# Patient Record
Sex: Female | Born: 1935
Health system: Southern US, Community
[De-identification: ages and names within clinical notes are randomized; demographics above are authoritative.]

## PROBLEM LIST (undated history)

## (undated) DIAGNOSIS — E7800 Pure hypercholesterolemia, unspecified: Secondary | ICD-10-CM

## (undated) DIAGNOSIS — I10 Essential (primary) hypertension: Secondary | ICD-10-CM

## (undated) DIAGNOSIS — M109 Gout, unspecified: Secondary | ICD-10-CM

## (undated) HISTORY — PX: TOTAL HIP ARTHROPLASTY: SHX124

---

## 2002-05-08 ENCOUNTER — Encounter: Payer: Self-pay | Admitting: Internal Medicine

## 2002-05-08 ENCOUNTER — Encounter: Admission: RE | Admit: 2002-05-08 | Discharge: 2002-05-08 | Payer: Self-pay | Admitting: Internal Medicine

## 2003-10-28 ENCOUNTER — Encounter: Admission: RE | Admit: 2003-10-28 | Discharge: 2003-10-28 | Payer: Self-pay | Admitting: Internal Medicine

## 2004-10-17 ENCOUNTER — Encounter: Admission: RE | Admit: 2004-10-17 | Discharge: 2004-10-17 | Payer: Self-pay | Admitting: Internal Medicine

## 2005-04-26 ENCOUNTER — Encounter: Admission: RE | Admit: 2005-04-26 | Discharge: 2005-04-26 | Payer: Self-pay | Admitting: Internal Medicine

## 2008-03-18 ENCOUNTER — Encounter: Admission: RE | Admit: 2008-03-18 | Discharge: 2008-03-18 | Payer: Self-pay | Admitting: Internal Medicine

## 2009-11-18 ENCOUNTER — Encounter: Admission: RE | Admit: 2009-11-18 | Discharge: 2009-11-18 | Payer: Self-pay | Admitting: Internal Medicine

## 2009-12-06 ENCOUNTER — Encounter: Admission: RE | Admit: 2009-12-06 | Discharge: 2009-12-06 | Payer: Self-pay | Admitting: Internal Medicine

## 2012-10-24 ENCOUNTER — Other Ambulatory Visit: Payer: Self-pay | Admitting: Internal Medicine

## 2012-10-24 ENCOUNTER — Ambulatory Visit
Admission: RE | Admit: 2012-10-24 | Discharge: 2012-10-24 | Disposition: A | Discharge status: Home / Self Care | Payer: Commercial Managed Care - HMO | Source: Ambulatory Visit | Attending: Internal Medicine | Admitting: Internal Medicine

## 2012-10-24 DIAGNOSIS — R0789 Other chest pain: Secondary | ICD-10-CM

## 2013-06-17 ENCOUNTER — Other Ambulatory Visit: Payer: Self-pay | Admitting: Internal Medicine

## 2013-06-17 ENCOUNTER — Ambulatory Visit
Admission: RE | Admit: 2013-06-17 | Discharge: 2013-06-17 | Disposition: A | Discharge status: Home / Self Care | Payer: Commercial Managed Care - HMO | Source: Ambulatory Visit | Attending: Internal Medicine | Admitting: Internal Medicine

## 2013-06-17 DIAGNOSIS — R0602 Shortness of breath: Secondary | ICD-10-CM

## 2013-06-17 DIAGNOSIS — R0789 Other chest pain: Secondary | ICD-10-CM

## 2013-06-17 DIAGNOSIS — R911 Solitary pulmonary nodule: Secondary | ICD-10-CM

## 2015-01-13 DIAGNOSIS — H35033 Hypertensive retinopathy, bilateral: Secondary | ICD-10-CM | POA: Diagnosis not present

## 2015-01-19 DIAGNOSIS — Z139 Encounter for screening, unspecified: Secondary | ICD-10-CM | POA: Diagnosis not present

## 2015-01-19 DIAGNOSIS — Z780 Asymptomatic menopausal state: Secondary | ICD-10-CM | POA: Diagnosis not present

## 2015-01-19 DIAGNOSIS — Z1231 Encounter for screening mammogram for malignant neoplasm of breast: Secondary | ICD-10-CM | POA: Diagnosis not present

## 2015-03-02 DIAGNOSIS — N183 Chronic kidney disease, stage 3 (moderate): Secondary | ICD-10-CM | POA: Diagnosis not present

## 2015-03-02 DIAGNOSIS — I10 Essential (primary) hypertension: Secondary | ICD-10-CM | POA: Diagnosis not present

## 2015-03-07 DIAGNOSIS — Z6829 Body mass index (BMI) 29.0-29.9, adult: Secondary | ICD-10-CM | POA: Diagnosis not present

## 2015-03-07 DIAGNOSIS — I10 Essential (primary) hypertension: Secondary | ICD-10-CM | POA: Diagnosis not present

## 2015-03-07 DIAGNOSIS — H409 Unspecified glaucoma: Secondary | ICD-10-CM | POA: Diagnosis not present

## 2015-03-07 DIAGNOSIS — E663 Overweight: Secondary | ICD-10-CM | POA: Diagnosis not present

## 2015-03-07 DIAGNOSIS — M1990 Unspecified osteoarthritis, unspecified site: Secondary | ICD-10-CM | POA: Diagnosis not present

## 2015-03-07 DIAGNOSIS — M109 Gout, unspecified: Secondary | ICD-10-CM | POA: Diagnosis not present

## 2015-03-07 DIAGNOSIS — E785 Hyperlipidemia, unspecified: Secondary | ICD-10-CM | POA: Diagnosis not present

## 2015-03-07 DIAGNOSIS — M545 Low back pain: Secondary | ICD-10-CM | POA: Diagnosis not present

## 2015-03-12 DIAGNOSIS — Z131 Encounter for screening for diabetes mellitus: Secondary | ICD-10-CM | POA: Diagnosis not present

## 2015-03-12 DIAGNOSIS — N183 Chronic kidney disease, stage 3 (moderate): Secondary | ICD-10-CM | POA: Diagnosis not present

## 2015-03-12 DIAGNOSIS — E7211 Homocystinuria: Secondary | ICD-10-CM | POA: Diagnosis not present

## 2015-03-12 DIAGNOSIS — I119 Hypertensive heart disease without heart failure: Secondary | ICD-10-CM | POA: Diagnosis not present

## 2015-03-12 DIAGNOSIS — Z01118 Encounter for examination of ears and hearing with other abnormal findings: Secondary | ICD-10-CM | POA: Diagnosis not present

## 2015-03-12 DIAGNOSIS — Z0000 Encounter for general adult medical examination without abnormal findings: Secondary | ICD-10-CM | POA: Diagnosis not present

## 2015-03-12 DIAGNOSIS — J302 Other seasonal allergic rhinitis: Secondary | ICD-10-CM | POA: Diagnosis not present

## 2015-03-12 DIAGNOSIS — E559 Vitamin D deficiency, unspecified: Secondary | ICD-10-CM | POA: Diagnosis not present

## 2015-03-12 DIAGNOSIS — I10 Essential (primary) hypertension: Secondary | ICD-10-CM | POA: Diagnosis not present

## 2015-03-12 DIAGNOSIS — E785 Hyperlipidemia, unspecified: Secondary | ICD-10-CM | POA: Diagnosis not present

## 2015-04-01 DIAGNOSIS — I10 Essential (primary) hypertension: Secondary | ICD-10-CM | POA: Diagnosis not present

## 2015-04-01 DIAGNOSIS — N183 Chronic kidney disease, stage 3 (moderate): Secondary | ICD-10-CM | POA: Diagnosis not present

## 2015-04-08 DIAGNOSIS — N183 Chronic kidney disease, stage 3 (moderate): Secondary | ICD-10-CM | POA: Diagnosis not present

## 2015-04-08 DIAGNOSIS — I10 Essential (primary) hypertension: Secondary | ICD-10-CM | POA: Diagnosis not present

## 2015-05-13 DIAGNOSIS — I10 Essential (primary) hypertension: Secondary | ICD-10-CM | POA: Diagnosis not present

## 2015-05-13 DIAGNOSIS — N183 Chronic kidney disease, stage 3 (moderate): Secondary | ICD-10-CM | POA: Diagnosis not present

## 2015-05-20 DIAGNOSIS — Z131 Encounter for screening for diabetes mellitus: Secondary | ICD-10-CM | POA: Diagnosis not present

## 2015-05-20 DIAGNOSIS — I10 Essential (primary) hypertension: Secondary | ICD-10-CM | POA: Diagnosis not present

## 2015-05-20 DIAGNOSIS — J302 Other seasonal allergic rhinitis: Secondary | ICD-10-CM | POA: Diagnosis not present

## 2015-05-20 DIAGNOSIS — E7211 Homocystinuria: Secondary | ICD-10-CM | POA: Diagnosis not present

## 2015-05-20 DIAGNOSIS — E559 Vitamin D deficiency, unspecified: Secondary | ICD-10-CM | POA: Diagnosis not present

## 2015-05-20 DIAGNOSIS — E785 Hyperlipidemia, unspecified: Secondary | ICD-10-CM | POA: Diagnosis not present

## 2015-05-20 DIAGNOSIS — N183 Chronic kidney disease, stage 3 (moderate): Secondary | ICD-10-CM | POA: Diagnosis not present

## 2015-05-20 DIAGNOSIS — Z0000 Encounter for general adult medical examination without abnormal findings: Secondary | ICD-10-CM | POA: Diagnosis not present

## 2015-05-20 DIAGNOSIS — I119 Hypertensive heart disease without heart failure: Secondary | ICD-10-CM | POA: Diagnosis not present

## 2015-06-11 DIAGNOSIS — N183 Chronic kidney disease, stage 3 (moderate): Secondary | ICD-10-CM | POA: Diagnosis not present

## 2015-06-11 DIAGNOSIS — I10 Essential (primary) hypertension: Secondary | ICD-10-CM | POA: Diagnosis not present

## 2015-07-19 DIAGNOSIS — H401131 Primary open-angle glaucoma, bilateral, mild stage: Secondary | ICD-10-CM | POA: Diagnosis not present

## 2015-08-09 DIAGNOSIS — R040 Epistaxis: Secondary | ICD-10-CM | POA: Diagnosis not present

## 2015-08-09 DIAGNOSIS — I10 Essential (primary) hypertension: Secondary | ICD-10-CM | POA: Diagnosis not present

## 2015-08-10 DIAGNOSIS — I10 Essential (primary) hypertension: Secondary | ICD-10-CM | POA: Diagnosis not present

## 2015-08-10 DIAGNOSIS — N183 Chronic kidney disease, stage 3 (moderate): Secondary | ICD-10-CM | POA: Diagnosis not present

## 2015-08-10 DIAGNOSIS — I119 Hypertensive heart disease without heart failure: Secondary | ICD-10-CM | POA: Diagnosis not present

## 2015-08-10 DIAGNOSIS — R0789 Other chest pain: Secondary | ICD-10-CM | POA: Diagnosis not present

## 2015-08-10 DIAGNOSIS — E785 Hyperlipidemia, unspecified: Secondary | ICD-10-CM | POA: Diagnosis not present

## 2015-08-10 DIAGNOSIS — E7211 Homocystinuria: Secondary | ICD-10-CM | POA: Diagnosis not present

## 2015-08-10 DIAGNOSIS — J302 Other seasonal allergic rhinitis: Secondary | ICD-10-CM | POA: Diagnosis not present

## 2015-08-10 DIAGNOSIS — E559 Vitamin D deficiency, unspecified: Secondary | ICD-10-CM | POA: Diagnosis not present

## 2015-08-11 DIAGNOSIS — Z7982 Long term (current) use of aspirin: Secondary | ICD-10-CM | POA: Diagnosis not present

## 2015-08-11 DIAGNOSIS — I10 Essential (primary) hypertension: Secondary | ICD-10-CM | POA: Diagnosis not present

## 2015-08-11 DIAGNOSIS — R040 Epistaxis: Secondary | ICD-10-CM | POA: Diagnosis not present

## 2015-08-27 DIAGNOSIS — R0602 Shortness of breath: Secondary | ICD-10-CM | POA: Diagnosis not present

## 2015-08-27 DIAGNOSIS — C3490 Malignant neoplasm of unspecified part of unspecified bronchus or lung: Secondary | ICD-10-CM | POA: Diagnosis not present

## 2015-08-27 DIAGNOSIS — G8922 Chronic post-thoracotomy pain: Secondary | ICD-10-CM | POA: Diagnosis not present

## 2015-08-27 DIAGNOSIS — Z87891 Personal history of nicotine dependence: Secondary | ICD-10-CM | POA: Diagnosis not present

## 2015-08-27 DIAGNOSIS — F17200 Nicotine dependence, unspecified, uncomplicated: Secondary | ICD-10-CM | POA: Diagnosis not present

## 2015-09-16 DIAGNOSIS — Z122 Encounter for screening for malignant neoplasm of respiratory organs: Secondary | ICD-10-CM | POA: Diagnosis not present

## 2015-09-16 DIAGNOSIS — Z87891 Personal history of nicotine dependence: Secondary | ICD-10-CM | POA: Diagnosis not present

## 2015-09-23 DIAGNOSIS — R0789 Other chest pain: Secondary | ICD-10-CM | POA: Diagnosis not present

## 2015-09-23 DIAGNOSIS — G8929 Other chronic pain: Secondary | ICD-10-CM | POA: Diagnosis not present

## 2015-11-19 DIAGNOSIS — I10 Essential (primary) hypertension: Secondary | ICD-10-CM | POA: Diagnosis not present

## 2015-11-19 DIAGNOSIS — I119 Hypertensive heart disease without heart failure: Secondary | ICD-10-CM | POA: Diagnosis not present

## 2015-11-19 DIAGNOSIS — J302 Other seasonal allergic rhinitis: Secondary | ICD-10-CM | POA: Diagnosis not present

## 2015-11-19 DIAGNOSIS — E7211 Homocystinuria: Secondary | ICD-10-CM | POA: Diagnosis not present

## 2015-11-19 DIAGNOSIS — E559 Vitamin D deficiency, unspecified: Secondary | ICD-10-CM | POA: Diagnosis not present

## 2015-11-19 DIAGNOSIS — N183 Chronic kidney disease, stage 3 (moderate): Secondary | ICD-10-CM | POA: Diagnosis not present

## 2015-11-19 DIAGNOSIS — E785 Hyperlipidemia, unspecified: Secondary | ICD-10-CM | POA: Diagnosis not present

## 2016-05-16 DIAGNOSIS — R0789 Other chest pain: Secondary | ICD-10-CM | POA: Diagnosis not present

## 2016-05-16 DIAGNOSIS — E7211 Homocystinuria: Secondary | ICD-10-CM | POA: Diagnosis not present

## 2016-05-16 DIAGNOSIS — I119 Hypertensive heart disease without heart failure: Secondary | ICD-10-CM | POA: Diagnosis not present

## 2016-05-16 DIAGNOSIS — E785 Hyperlipidemia, unspecified: Secondary | ICD-10-CM | POA: Diagnosis not present

## 2016-05-16 DIAGNOSIS — Z0000 Encounter for general adult medical examination without abnormal findings: Secondary | ICD-10-CM | POA: Diagnosis not present

## 2016-05-16 DIAGNOSIS — I10 Essential (primary) hypertension: Secondary | ICD-10-CM | POA: Diagnosis not present

## 2016-05-16 DIAGNOSIS — J302 Other seasonal allergic rhinitis: Secondary | ICD-10-CM | POA: Diagnosis not present

## 2016-05-16 DIAGNOSIS — N183 Chronic kidney disease, stage 3 (moderate): Secondary | ICD-10-CM | POA: Diagnosis not present

## 2016-05-16 DIAGNOSIS — E559 Vitamin D deficiency, unspecified: Secondary | ICD-10-CM | POA: Diagnosis not present

## 2016-06-16 ENCOUNTER — Other Ambulatory Visit: Payer: Self-pay | Admitting: Internal Medicine

## 2016-06-16 DIAGNOSIS — M79605 Pain in left leg: Secondary | ICD-10-CM | POA: Diagnosis not present

## 2016-06-16 DIAGNOSIS — E559 Vitamin D deficiency, unspecified: Secondary | ICD-10-CM | POA: Diagnosis not present

## 2016-06-16 DIAGNOSIS — R609 Edema, unspecified: Secondary | ICD-10-CM

## 2016-06-16 DIAGNOSIS — E7211 Homocystinuria: Secondary | ICD-10-CM | POA: Diagnosis not present

## 2016-06-16 DIAGNOSIS — E785 Hyperlipidemia, unspecified: Secondary | ICD-10-CM | POA: Diagnosis not present

## 2016-06-16 DIAGNOSIS — J302 Other seasonal allergic rhinitis: Secondary | ICD-10-CM | POA: Diagnosis not present

## 2016-06-16 DIAGNOSIS — R55 Syncope and collapse: Secondary | ICD-10-CM | POA: Diagnosis not present

## 2016-06-16 DIAGNOSIS — N183 Chronic kidney disease, stage 3 (moderate): Secondary | ICD-10-CM | POA: Diagnosis not present

## 2016-06-16 DIAGNOSIS — I119 Hypertensive heart disease without heart failure: Secondary | ICD-10-CM | POA: Diagnosis not present

## 2016-06-16 DIAGNOSIS — I10 Essential (primary) hypertension: Secondary | ICD-10-CM | POA: Diagnosis not present

## 2016-06-19 ENCOUNTER — Other Ambulatory Visit: Payer: Medicare HMO

## 2016-06-22 DIAGNOSIS — M7989 Other specified soft tissue disorders: Secondary | ICD-10-CM | POA: Diagnosis not present

## 2016-06-22 DIAGNOSIS — M79605 Pain in left leg: Secondary | ICD-10-CM | POA: Diagnosis not present

## 2016-07-03 DIAGNOSIS — N183 Chronic kidney disease, stage 3 (moderate): Secondary | ICD-10-CM | POA: Diagnosis not present

## 2016-07-03 DIAGNOSIS — E785 Hyperlipidemia, unspecified: Secondary | ICD-10-CM | POA: Diagnosis not present

## 2016-07-03 DIAGNOSIS — I10 Essential (primary) hypertension: Secondary | ICD-10-CM | POA: Diagnosis not present

## 2016-07-03 DIAGNOSIS — M79605 Pain in left leg: Secondary | ICD-10-CM | POA: Diagnosis not present

## 2016-07-03 DIAGNOSIS — E7211 Homocystinuria: Secondary | ICD-10-CM | POA: Diagnosis not present

## 2016-07-03 DIAGNOSIS — Z01118 Encounter for examination of ears and hearing with other abnormal findings: Secondary | ICD-10-CM | POA: Diagnosis not present

## 2016-07-03 DIAGNOSIS — Z0000 Encounter for general adult medical examination without abnormal findings: Secondary | ICD-10-CM | POA: Diagnosis not present

## 2016-07-03 DIAGNOSIS — E559 Vitamin D deficiency, unspecified: Secondary | ICD-10-CM | POA: Diagnosis not present

## 2016-07-03 DIAGNOSIS — I119 Hypertensive heart disease without heart failure: Secondary | ICD-10-CM | POA: Diagnosis not present

## 2016-07-20 DIAGNOSIS — Z1231 Encounter for screening mammogram for malignant neoplasm of breast: Secondary | ICD-10-CM | POA: Diagnosis not present

## 2016-07-26 DIAGNOSIS — H524 Presbyopia: Secondary | ICD-10-CM | POA: Diagnosis not present

## 2016-09-12 DIAGNOSIS — M25512 Pain in left shoulder: Secondary | ICD-10-CM | POA: Diagnosis not present

## 2016-09-12 DIAGNOSIS — J302 Other seasonal allergic rhinitis: Secondary | ICD-10-CM | POA: Diagnosis not present

## 2016-09-12 DIAGNOSIS — E7211 Homocystinuria: Secondary | ICD-10-CM | POA: Diagnosis not present

## 2016-09-12 DIAGNOSIS — I10 Essential (primary) hypertension: Secondary | ICD-10-CM | POA: Diagnosis not present

## 2016-09-12 DIAGNOSIS — I119 Hypertensive heart disease without heart failure: Secondary | ICD-10-CM | POA: Diagnosis not present

## 2016-09-12 DIAGNOSIS — E559 Vitamin D deficiency, unspecified: Secondary | ICD-10-CM | POA: Diagnosis not present

## 2016-09-12 DIAGNOSIS — N183 Chronic kidney disease, stage 3 (moderate): Secondary | ICD-10-CM | POA: Diagnosis not present

## 2016-09-12 DIAGNOSIS — E785 Hyperlipidemia, unspecified: Secondary | ICD-10-CM | POA: Diagnosis not present

## 2016-11-22 DIAGNOSIS — I119 Hypertensive heart disease without heart failure: Secondary | ICD-10-CM | POA: Diagnosis not present

## 2016-11-22 DIAGNOSIS — I10 Essential (primary) hypertension: Secondary | ICD-10-CM | POA: Diagnosis not present

## 2017-01-18 DIAGNOSIS — N183 Chronic kidney disease, stage 3 (moderate): Secondary | ICD-10-CM | POA: Diagnosis not present

## 2017-01-18 DIAGNOSIS — E7211 Homocystinuria: Secondary | ICD-10-CM | POA: Diagnosis not present

## 2017-01-18 DIAGNOSIS — R002 Palpitations: Secondary | ICD-10-CM | POA: Diagnosis not present

## 2017-01-18 DIAGNOSIS — J302 Other seasonal allergic rhinitis: Secondary | ICD-10-CM | POA: Diagnosis not present

## 2017-01-18 DIAGNOSIS — I119 Hypertensive heart disease without heart failure: Secondary | ICD-10-CM | POA: Diagnosis not present

## 2017-01-18 DIAGNOSIS — E785 Hyperlipidemia, unspecified: Secondary | ICD-10-CM | POA: Diagnosis not present

## 2017-01-18 DIAGNOSIS — I10 Essential (primary) hypertension: Secondary | ICD-10-CM | POA: Diagnosis not present

## 2017-01-18 DIAGNOSIS — E559 Vitamin D deficiency, unspecified: Secondary | ICD-10-CM | POA: Diagnosis not present

## 2017-02-07 DIAGNOSIS — N183 Chronic kidney disease, stage 3 (moderate): Secondary | ICD-10-CM | POA: Diagnosis not present

## 2017-02-07 DIAGNOSIS — I10 Essential (primary) hypertension: Secondary | ICD-10-CM | POA: Diagnosis not present

## 2017-02-23 DIAGNOSIS — I10 Essential (primary) hypertension: Secondary | ICD-10-CM | POA: Diagnosis not present

## 2017-03-07 DIAGNOSIS — I10 Essential (primary) hypertension: Secondary | ICD-10-CM | POA: Diagnosis not present

## 2017-03-07 DIAGNOSIS — E785 Hyperlipidemia, unspecified: Secondary | ICD-10-CM | POA: Diagnosis not present

## 2017-03-29 DIAGNOSIS — F439 Reaction to severe stress, unspecified: Secondary | ICD-10-CM | POA: Diagnosis not present

## 2017-03-29 DIAGNOSIS — I10 Essential (primary) hypertension: Secondary | ICD-10-CM | POA: Diagnosis not present

## 2017-03-29 DIAGNOSIS — I119 Hypertensive heart disease without heart failure: Secondary | ICD-10-CM | POA: Diagnosis not present

## 2017-03-29 DIAGNOSIS — J302 Other seasonal allergic rhinitis: Secondary | ICD-10-CM | POA: Diagnosis not present

## 2017-03-29 DIAGNOSIS — E7211 Homocystinuria: Secondary | ICD-10-CM | POA: Diagnosis not present

## 2017-03-29 DIAGNOSIS — N183 Chronic kidney disease, stage 3 (moderate): Secondary | ICD-10-CM | POA: Diagnosis not present

## 2017-03-29 DIAGNOSIS — E559 Vitamin D deficiency, unspecified: Secondary | ICD-10-CM | POA: Diagnosis not present

## 2017-03-29 DIAGNOSIS — E785 Hyperlipidemia, unspecified: Secondary | ICD-10-CM | POA: Diagnosis not present

## 2017-04-18 DIAGNOSIS — I10 Essential (primary) hypertension: Secondary | ICD-10-CM | POA: Diagnosis not present

## 2017-04-18 DIAGNOSIS — E785 Hyperlipidemia, unspecified: Secondary | ICD-10-CM | POA: Diagnosis not present

## 2017-05-14 DIAGNOSIS — M2042 Other hammer toe(s) (acquired), left foot: Secondary | ICD-10-CM | POA: Diagnosis not present

## 2017-05-17 DIAGNOSIS — E785 Hyperlipidemia, unspecified: Secondary | ICD-10-CM | POA: Diagnosis not present

## 2017-05-17 DIAGNOSIS — F439 Reaction to severe stress, unspecified: Secondary | ICD-10-CM | POA: Diagnosis not present

## 2017-05-17 DIAGNOSIS — I10 Essential (primary) hypertension: Secondary | ICD-10-CM | POA: Diagnosis not present

## 2017-05-17 DIAGNOSIS — E559 Vitamin D deficiency, unspecified: Secondary | ICD-10-CM | POA: Diagnosis not present

## 2017-05-17 DIAGNOSIS — N183 Chronic kidney disease, stage 3 (moderate): Secondary | ICD-10-CM | POA: Diagnosis not present

## 2017-05-17 DIAGNOSIS — J302 Other seasonal allergic rhinitis: Secondary | ICD-10-CM | POA: Diagnosis not present

## 2017-05-17 DIAGNOSIS — Z0000 Encounter for general adult medical examination without abnormal findings: Secondary | ICD-10-CM | POA: Diagnosis not present

## 2017-05-17 DIAGNOSIS — E7211 Homocystinuria: Secondary | ICD-10-CM | POA: Diagnosis not present

## 2017-05-17 DIAGNOSIS — I119 Hypertensive heart disease without heart failure: Secondary | ICD-10-CM | POA: Diagnosis not present

## 2017-05-21 DIAGNOSIS — I10 Essential (primary) hypertension: Secondary | ICD-10-CM | POA: Diagnosis not present

## 2017-05-21 DIAGNOSIS — E785 Hyperlipidemia, unspecified: Secondary | ICD-10-CM | POA: Diagnosis not present

## 2017-05-21 DIAGNOSIS — Z7982 Long term (current) use of aspirin: Secondary | ICD-10-CM | POA: Diagnosis not present

## 2017-05-21 DIAGNOSIS — R002 Palpitations: Secondary | ICD-10-CM | POA: Diagnosis not present

## 2017-05-23 DIAGNOSIS — I10 Essential (primary) hypertension: Secondary | ICD-10-CM | POA: Diagnosis not present

## 2017-05-23 DIAGNOSIS — M2042 Other hammer toe(s) (acquired), left foot: Secondary | ICD-10-CM | POA: Diagnosis not present

## 2017-05-23 DIAGNOSIS — E785 Hyperlipidemia, unspecified: Secondary | ICD-10-CM | POA: Diagnosis not present

## 2017-07-13 DIAGNOSIS — E785 Hyperlipidemia, unspecified: Secondary | ICD-10-CM | POA: Diagnosis not present

## 2017-07-13 DIAGNOSIS — N183 Chronic kidney disease, stage 3 (moderate): Secondary | ICD-10-CM | POA: Diagnosis not present

## 2017-07-13 DIAGNOSIS — Z01118 Encounter for examination of ears and hearing with other abnormal findings: Secondary | ICD-10-CM | POA: Diagnosis not present

## 2017-07-13 DIAGNOSIS — I119 Hypertensive heart disease without heart failure: Secondary | ICD-10-CM | POA: Diagnosis not present

## 2017-07-13 DIAGNOSIS — Z0000 Encounter for general adult medical examination without abnormal findings: Secondary | ICD-10-CM | POA: Diagnosis not present

## 2017-07-13 DIAGNOSIS — Z136 Encounter for screening for cardiovascular disorders: Secondary | ICD-10-CM | POA: Diagnosis not present

## 2017-07-13 DIAGNOSIS — Z131 Encounter for screening for diabetes mellitus: Secondary | ICD-10-CM | POA: Diagnosis not present

## 2017-07-13 DIAGNOSIS — E7211 Homocystinuria: Secondary | ICD-10-CM | POA: Diagnosis not present

## 2017-07-13 DIAGNOSIS — I10 Essential (primary) hypertension: Secondary | ICD-10-CM | POA: Diagnosis not present

## 2017-08-10 DIAGNOSIS — Z780 Asymptomatic menopausal state: Secondary | ICD-10-CM | POA: Diagnosis not present

## 2017-08-10 DIAGNOSIS — E2839 Other primary ovarian failure: Secondary | ICD-10-CM | POA: Diagnosis not present

## 2017-08-10 DIAGNOSIS — Z1231 Encounter for screening mammogram for malignant neoplasm of breast: Secondary | ICD-10-CM | POA: Diagnosis not present

## 2017-08-10 DIAGNOSIS — Z13820 Encounter for screening for osteoporosis: Secondary | ICD-10-CM | POA: Diagnosis not present

## 2017-08-28 DIAGNOSIS — H524 Presbyopia: Secondary | ICD-10-CM | POA: Diagnosis not present

## 2017-09-07 DIAGNOSIS — I10 Essential (primary) hypertension: Secondary | ICD-10-CM | POA: Diagnosis not present

## 2017-09-07 DIAGNOSIS — F439 Reaction to severe stress, unspecified: Secondary | ICD-10-CM | POA: Diagnosis not present

## 2017-09-07 DIAGNOSIS — E559 Vitamin D deficiency, unspecified: Secondary | ICD-10-CM | POA: Diagnosis not present

## 2017-09-07 DIAGNOSIS — J302 Other seasonal allergic rhinitis: Secondary | ICD-10-CM | POA: Diagnosis not present

## 2017-09-07 DIAGNOSIS — N183 Chronic kidney disease, stage 3 (moderate): Secondary | ICD-10-CM | POA: Diagnosis not present

## 2017-09-07 DIAGNOSIS — I119 Hypertensive heart disease without heart failure: Secondary | ICD-10-CM | POA: Diagnosis not present

## 2017-09-07 DIAGNOSIS — E785 Hyperlipidemia, unspecified: Secondary | ICD-10-CM | POA: Diagnosis not present

## 2017-09-07 DIAGNOSIS — E7211 Homocystinuria: Secondary | ICD-10-CM | POA: Diagnosis not present

## 2017-09-21 DIAGNOSIS — R809 Proteinuria, unspecified: Secondary | ICD-10-CM | POA: Diagnosis not present

## 2017-09-21 DIAGNOSIS — N183 Chronic kidney disease, stage 3 (moderate): Secondary | ICD-10-CM | POA: Diagnosis not present

## 2017-09-21 DIAGNOSIS — N390 Urinary tract infection, site not specified: Secondary | ICD-10-CM | POA: Diagnosis not present

## 2017-09-21 DIAGNOSIS — E559 Vitamin D deficiency, unspecified: Secondary | ICD-10-CM | POA: Diagnosis not present

## 2017-09-21 DIAGNOSIS — I10 Essential (primary) hypertension: Secondary | ICD-10-CM | POA: Diagnosis not present

## 2017-10-30 DIAGNOSIS — I10 Essential (primary) hypertension: Secondary | ICD-10-CM | POA: Diagnosis not present

## 2017-10-30 DIAGNOSIS — I110 Hypertensive heart disease with heart failure: Secondary | ICD-10-CM | POA: Diagnosis not present

## 2017-10-30 DIAGNOSIS — E559 Vitamin D deficiency, unspecified: Secondary | ICD-10-CM | POA: Diagnosis not present

## 2017-10-30 DIAGNOSIS — N183 Chronic kidney disease, stage 3 (moderate): Secondary | ICD-10-CM | POA: Diagnosis not present

## 2017-10-30 DIAGNOSIS — R946 Abnormal results of thyroid function studies: Secondary | ICD-10-CM | POA: Diagnosis not present

## 2017-11-01 DIAGNOSIS — N189 Chronic kidney disease, unspecified: Secondary | ICD-10-CM | POA: Diagnosis not present

## 2017-11-01 DIAGNOSIS — N183 Chronic kidney disease, stage 3 (moderate): Secondary | ICD-10-CM | POA: Diagnosis not present

## 2017-11-09 DIAGNOSIS — I119 Hypertensive heart disease without heart failure: Secondary | ICD-10-CM | POA: Diagnosis not present

## 2017-11-09 DIAGNOSIS — J302 Other seasonal allergic rhinitis: Secondary | ICD-10-CM | POA: Diagnosis not present

## 2017-11-09 DIAGNOSIS — E559 Vitamin D deficiency, unspecified: Secondary | ICD-10-CM | POA: Diagnosis not present

## 2017-11-09 DIAGNOSIS — N183 Chronic kidney disease, stage 3 (moderate): Secondary | ICD-10-CM | POA: Diagnosis not present

## 2017-11-09 DIAGNOSIS — E7800 Pure hypercholesterolemia, unspecified: Secondary | ICD-10-CM | POA: Diagnosis not present

## 2017-11-09 DIAGNOSIS — I10 Essential (primary) hypertension: Secondary | ICD-10-CM | POA: Diagnosis not present

## 2017-11-09 DIAGNOSIS — E7211 Homocystinuria: Secondary | ICD-10-CM | POA: Diagnosis not present

## 2017-11-09 DIAGNOSIS — R7303 Prediabetes: Secondary | ICD-10-CM | POA: Diagnosis not present

## 2017-11-09 DIAGNOSIS — F439 Reaction to severe stress, unspecified: Secondary | ICD-10-CM | POA: Diagnosis not present

## 2017-11-13 DIAGNOSIS — I10 Essential (primary) hypertension: Secondary | ICD-10-CM | POA: Diagnosis not present

## 2017-11-13 DIAGNOSIS — N183 Chronic kidney disease, stage 3 (moderate): Secondary | ICD-10-CM | POA: Diagnosis not present

## 2017-11-13 DIAGNOSIS — E559 Vitamin D deficiency, unspecified: Secondary | ICD-10-CM | POA: Diagnosis not present

## 2017-11-13 DIAGNOSIS — N390 Urinary tract infection, site not specified: Secondary | ICD-10-CM | POA: Diagnosis not present

## 2017-11-23 DIAGNOSIS — I119 Hypertensive heart disease without heart failure: Secondary | ICD-10-CM | POA: Diagnosis not present

## 2017-11-23 DIAGNOSIS — I10 Essential (primary) hypertension: Secondary | ICD-10-CM | POA: Diagnosis not present

## 2017-11-23 DIAGNOSIS — E559 Vitamin D deficiency, unspecified: Secondary | ICD-10-CM | POA: Diagnosis not present

## 2017-11-23 DIAGNOSIS — E7211 Homocystinuria: Secondary | ICD-10-CM | POA: Diagnosis not present

## 2017-11-23 DIAGNOSIS — M1710 Unilateral primary osteoarthritis, unspecified knee: Secondary | ICD-10-CM | POA: Diagnosis not present

## 2017-11-23 DIAGNOSIS — F439 Reaction to severe stress, unspecified: Secondary | ICD-10-CM | POA: Diagnosis not present

## 2017-11-23 DIAGNOSIS — E785 Hyperlipidemia, unspecified: Secondary | ICD-10-CM | POA: Diagnosis not present

## 2017-11-23 DIAGNOSIS — N183 Chronic kidney disease, stage 3 (moderate): Secondary | ICD-10-CM | POA: Diagnosis not present

## 2017-11-23 DIAGNOSIS — J302 Other seasonal allergic rhinitis: Secondary | ICD-10-CM | POA: Diagnosis not present

## 2018-01-21 DIAGNOSIS — I119 Hypertensive heart disease without heart failure: Secondary | ICD-10-CM | POA: Diagnosis not present

## 2018-01-21 DIAGNOSIS — N183 Chronic kidney disease, stage 3 (moderate): Secondary | ICD-10-CM | POA: Diagnosis not present

## 2018-01-21 DIAGNOSIS — M1710 Unilateral primary osteoarthritis, unspecified knee: Secondary | ICD-10-CM | POA: Diagnosis not present

## 2018-01-21 DIAGNOSIS — E782 Mixed hyperlipidemia: Secondary | ICD-10-CM | POA: Diagnosis not present

## 2018-01-21 DIAGNOSIS — E559 Vitamin D deficiency, unspecified: Secondary | ICD-10-CM | POA: Diagnosis not present

## 2018-01-21 DIAGNOSIS — E7211 Homocystinuria: Secondary | ICD-10-CM | POA: Diagnosis not present

## 2018-01-21 DIAGNOSIS — J302 Other seasonal allergic rhinitis: Secondary | ICD-10-CM | POA: Diagnosis not present

## 2018-01-25 DIAGNOSIS — I10 Essential (primary) hypertension: Secondary | ICD-10-CM | POA: Diagnosis not present

## 2018-04-16 DIAGNOSIS — N183 Chronic kidney disease, stage 3 (moderate): Secondary | ICD-10-CM | POA: Diagnosis not present

## 2018-04-16 DIAGNOSIS — I10 Essential (primary) hypertension: Secondary | ICD-10-CM | POA: Diagnosis not present

## 2018-04-16 DIAGNOSIS — E559 Vitamin D deficiency, unspecified: Secondary | ICD-10-CM | POA: Diagnosis not present

## 2018-04-17 DIAGNOSIS — N183 Chronic kidney disease, stage 3 (moderate): Secondary | ICD-10-CM | POA: Diagnosis not present

## 2018-04-17 DIAGNOSIS — E559 Vitamin D deficiency, unspecified: Secondary | ICD-10-CM | POA: Diagnosis not present

## 2018-04-17 DIAGNOSIS — I10 Essential (primary) hypertension: Secondary | ICD-10-CM | POA: Diagnosis not present

## 2018-04-17 DIAGNOSIS — N390 Urinary tract infection, site not specified: Secondary | ICD-10-CM | POA: Diagnosis not present

## 2018-07-01 DIAGNOSIS — E7211 Homocystinuria: Secondary | ICD-10-CM | POA: Diagnosis not present

## 2018-07-01 DIAGNOSIS — E559 Vitamin D deficiency, unspecified: Secondary | ICD-10-CM | POA: Diagnosis not present

## 2018-07-01 DIAGNOSIS — M1710 Unilateral primary osteoarthritis, unspecified knee: Secondary | ICD-10-CM | POA: Diagnosis not present

## 2018-07-01 DIAGNOSIS — N183 Chronic kidney disease, stage 3 (moderate): Secondary | ICD-10-CM | POA: Diagnosis not present

## 2018-07-01 DIAGNOSIS — I119 Hypertensive heart disease without heart failure: Secondary | ICD-10-CM | POA: Diagnosis not present

## 2018-07-01 DIAGNOSIS — Z0001 Encounter for general adult medical examination with abnormal findings: Secondary | ICD-10-CM | POA: Diagnosis not present

## 2018-07-01 DIAGNOSIS — E782 Mixed hyperlipidemia: Secondary | ICD-10-CM | POA: Diagnosis not present

## 2018-07-01 DIAGNOSIS — J302 Other seasonal allergic rhinitis: Secondary | ICD-10-CM | POA: Diagnosis not present

## 2018-07-15 DIAGNOSIS — Z1389 Encounter for screening for other disorder: Secondary | ICD-10-CM | POA: Diagnosis not present

## 2018-07-15 DIAGNOSIS — Z01021 Encounter for examination of eyes and vision following failed vision screening with abnormal findings: Secondary | ICD-10-CM | POA: Diagnosis not present

## 2018-07-15 DIAGNOSIS — Z1329 Encounter for screening for other suspected endocrine disorder: Secondary | ICD-10-CM | POA: Diagnosis not present

## 2018-07-15 DIAGNOSIS — N183 Chronic kidney disease, stage 3 (moderate): Secondary | ICD-10-CM | POA: Diagnosis not present

## 2018-07-15 DIAGNOSIS — E7211 Homocystinuria: Secondary | ICD-10-CM | POA: Diagnosis not present

## 2018-07-15 DIAGNOSIS — E782 Mixed hyperlipidemia: Secondary | ICD-10-CM | POA: Diagnosis not present

## 2018-07-15 DIAGNOSIS — Z5181 Encounter for therapeutic drug level monitoring: Secondary | ICD-10-CM | POA: Diagnosis not present

## 2018-07-15 DIAGNOSIS — Z0001 Encounter for general adult medical examination with abnormal findings: Secondary | ICD-10-CM | POA: Diagnosis not present

## 2018-07-15 DIAGNOSIS — M1710 Unilateral primary osteoarthritis, unspecified knee: Secondary | ICD-10-CM | POA: Diagnosis not present

## 2018-07-15 DIAGNOSIS — E559 Vitamin D deficiency, unspecified: Secondary | ICD-10-CM | POA: Diagnosis not present

## 2018-07-15 DIAGNOSIS — Z01118 Encounter for examination of ears and hearing with other abnormal findings: Secondary | ICD-10-CM | POA: Diagnosis not present

## 2018-07-15 DIAGNOSIS — I119 Hypertensive heart disease without heart failure: Secondary | ICD-10-CM | POA: Diagnosis not present

## 2018-07-15 DIAGNOSIS — Z131 Encounter for screening for diabetes mellitus: Secondary | ICD-10-CM | POA: Diagnosis not present

## 2018-07-15 DIAGNOSIS — J302 Other seasonal allergic rhinitis: Secondary | ICD-10-CM | POA: Diagnosis not present

## 2018-09-27 DIAGNOSIS — N183 Chronic kidney disease, stage 3 (moderate): Secondary | ICD-10-CM | POA: Diagnosis not present

## 2018-09-27 DIAGNOSIS — E559 Vitamin D deficiency, unspecified: Secondary | ICD-10-CM | POA: Diagnosis not present

## 2018-09-27 DIAGNOSIS — E7211 Homocystinuria: Secondary | ICD-10-CM | POA: Diagnosis not present

## 2018-09-27 DIAGNOSIS — E782 Mixed hyperlipidemia: Secondary | ICD-10-CM | POA: Diagnosis not present

## 2018-09-27 DIAGNOSIS — M1710 Unilateral primary osteoarthritis, unspecified knee: Secondary | ICD-10-CM | POA: Diagnosis not present

## 2018-09-27 DIAGNOSIS — I119 Hypertensive heart disease without heart failure: Secondary | ICD-10-CM | POA: Diagnosis not present

## 2018-09-27 DIAGNOSIS — J302 Other seasonal allergic rhinitis: Secondary | ICD-10-CM | POA: Diagnosis not present

## 2018-10-24 DIAGNOSIS — N183 Chronic kidney disease, stage 3 unspecified: Secondary | ICD-10-CM | POA: Diagnosis not present

## 2018-10-24 DIAGNOSIS — I10 Essential (primary) hypertension: Secondary | ICD-10-CM | POA: Diagnosis not present

## 2018-10-24 DIAGNOSIS — E559 Vitamin D deficiency, unspecified: Secondary | ICD-10-CM | POA: Diagnosis not present

## 2018-10-28 DIAGNOSIS — N183 Chronic kidney disease, stage 3 unspecified: Secondary | ICD-10-CM | POA: Diagnosis not present

## 2018-10-28 DIAGNOSIS — E559 Vitamin D deficiency, unspecified: Secondary | ICD-10-CM | POA: Diagnosis not present

## 2018-10-28 DIAGNOSIS — I10 Essential (primary) hypertension: Secondary | ICD-10-CM | POA: Diagnosis not present

## 2018-11-19 DIAGNOSIS — H43391 Other vitreous opacities, right eye: Secondary | ICD-10-CM | POA: Diagnosis not present

## 2019-01-02 ENCOUNTER — Emergency Department (HOSPITAL_BASED_OUTPATIENT_CLINIC_OR_DEPARTMENT_OTHER): Payer: Medicare HMO

## 2019-01-02 ENCOUNTER — Emergency Department (HOSPITAL_BASED_OUTPATIENT_CLINIC_OR_DEPARTMENT_OTHER)
Admission: EM | Admit: 2019-01-02 | Discharge: 2019-01-02 | Disposition: A | Discharge status: Home / Self Care | Payer: Medicare HMO | Attending: Emergency Medicine | Admitting: Emergency Medicine

## 2019-01-02 ENCOUNTER — Other Ambulatory Visit: Payer: Self-pay

## 2019-01-02 ENCOUNTER — Encounter (HOSPITAL_BASED_OUTPATIENT_CLINIC_OR_DEPARTMENT_OTHER): Payer: Self-pay | Admitting: *Deleted

## 2019-01-02 DIAGNOSIS — E782 Mixed hyperlipidemia: Secondary | ICD-10-CM | POA: Diagnosis not present

## 2019-01-02 DIAGNOSIS — M79604 Pain in right leg: Secondary | ICD-10-CM

## 2019-01-02 DIAGNOSIS — M25551 Pain in right hip: Secondary | ICD-10-CM | POA: Insufficient documentation

## 2019-01-02 DIAGNOSIS — M79651 Pain in right thigh: Secondary | ICD-10-CM | POA: Diagnosis not present

## 2019-01-02 DIAGNOSIS — M25561 Pain in right knee: Secondary | ICD-10-CM | POA: Diagnosis not present

## 2019-01-02 DIAGNOSIS — Z96641 Presence of right artificial hip joint: Secondary | ICD-10-CM | POA: Diagnosis not present

## 2019-01-02 DIAGNOSIS — Z471 Aftercare following joint replacement surgery: Secondary | ICD-10-CM | POA: Diagnosis not present

## 2019-01-02 DIAGNOSIS — I10 Essential (primary) hypertension: Secondary | ICD-10-CM | POA: Diagnosis not present

## 2019-01-02 HISTORY — DX: Gout, unspecified: M10.9

## 2019-01-02 HISTORY — DX: Essential (primary) hypertension: I10

## 2019-01-02 HISTORY — DX: Pure hypercholesterolemia, unspecified: E78.00

## 2019-01-02 NOTE — ED Provider Notes (Addendum)
[facility] EMERGENCY DEPARTMENT Provider Note   CSN: [id number] Arrival date & time: 01/02/19  2119     History Chief Complaint  Patient presents with  . Hip Pain    Kimberly Travis is a 83 y.o. female.  HPI  83 year old female presents with right hip and knee pain.  Has been ongoing for a couple weeks.  No trauma/falls.  No fevers.  Right hip is there is one that was replaced back in 2008.  She is able to walk with a cane.  She has been taking Tylenol sporadically, most recently just prior to arrival here.  She has pain in her right thigh and wants to make sure she did not have a blood clot.  No calf swelling.  No weakness/numbness in her legs though sometimes her knee buckles. Had some pain in left hip but that seems to not be present now.  Past Medical History:  Diagnosis Date  . Gout   . High cholesterol   . Hypertension     There are no problems to display for this patient.   Past Surgical History:  Procedure Laterality Date  . TOTAL HIP ARTHROPLASTY       OB History   No obstetric history on file.     No family history on file.  Social History   Tobacco Use  . Smoking status: Never Smoker  . Smokeless tobacco: Never Used  Substance Use Topics  . Alcohol use: Yes    Comment: socially  . Drug use: Never    Home Medications Prior to Admission medications   Not on File    Allergies    Patient has no known allergies.  Review of Systems   Review of Systems  Constitutional: Negative for fever.  Musculoskeletal: Positive for arthralgias.  Neurological: Negative for weakness and numbness.  All other systems reviewed and are negative.   Physical Exam Updated Vital Signs BP (!) 168/69 (BP Location: Right Arm)   Pulse 60   Temp 98 F (36.7 C) (Oral)   Resp 18   Wt 74.8 kg   SpO2 100%   Physical Exam Vitals and nursing note reviewed.  Constitutional:      General: She is not in acute distress.    Appearance: She is  well-developed. She is not ill-appearing or diaphoretic.  HENT:     Head: Normocephalic and atraumatic.     Right Ear: External ear normal.     Left Ear: External ear normal.     Nose: Nose normal.  Eyes:     General:        Right eye: No discharge.        Left eye: No discharge.  Cardiovascular:     Rate and Rhythm: Normal rate and regular rhythm.     Pulses:          Dorsalis pedis pulses are 2+ on the right side and 2+ on the left side.  Pulmonary:     Effort: Pulmonary effort is normal.     Breath sounds: Normal breath sounds.  Abdominal:     Palpations: Abdomen is soft.     Tenderness: There is no abdominal tenderness.  Musculoskeletal:     Right hip: Tenderness present. Decreased range of motion (full passive ROM, painful active).     Left hip: No tenderness or bony tenderness. Normal range of motion.     Right upper leg: No swelling or tenderness.     Right knee: Swelling (mild) present.  No erythema. Tenderness present.     Right lower leg: No swelling or tenderness.     Comments: Right knee appears mildly swollen compared to left. No increased warmth or redness. Normal gross sensation in BLE.  Skin:    General: Skin is warm and dry.  Neurological:     Mental Status: She is alert.  Psychiatric:        Mood and Affect: Mood is not anxious.     ED Results / Procedures / Treatments   Labs (all labs ordered are listed, but only abnormal results are displayed) Labs Reviewed - No data to display  EKG None  Radiology US Venous Img Lower Right (DVT Study)  Result Date: 01/02/2019 CLINICAL DATA:  83 year old female with a history of right thigh pain EXAM: RIGHT LOWER EXTREMITY VENOUS DOPPLER ULTRASOUND TECHNIQUE: Gray-scale sonography with graded compression, as well as color Doppler and duplex ultrasound were performed to evaluate the lower extremity deep venous systems from the level of the common femoral vein and including the common femoral, femoral, profunda  femoral, popliteal and calf veins including the posterior tibial, peroneal and gastrocnemius veins when visible. The superficial great saphenous vein was also interrogated. Spectral Doppler was utilized to evaluate flow at rest and with distal augmentation maneuvers in the common femoral, femoral and popliteal veins. COMPARISON:  None. FINDINGS: Contralateral Common Femoral Vein: Respiratory phasicity is normal and symmetric with the symptomatic side. No evidence of thrombus. Normal compressibility. Common Femoral Vein: No evidence of thrombus. Normal compressibility, respiratory phasicity and response to augmentation. Saphenofemoral Junction: No evidence of thrombus. Normal compressibility and flow on color Doppler imaging. Profunda Femoral Vein: No evidence of thrombus. Normal compressibility and flow on color Doppler imaging. Femoral Vein: No evidence of thrombus. Normal compressibility, respiratory phasicity and response to augmentation. Popliteal Vein: No evidence of thrombus. Normal compressibility, respiratory phasicity and response to augmentation. Calf Veins: No evidence of thrombus. Normal compressibility and flow on color Doppler imaging. Superficial Great Saphenous Vein: No evidence of thrombus. Normal compressibility and flow on color Doppler imaging. Other Findings: Heterogeneously hypoechoic collection in the right inguinal region, lateral to the femoral vasculature of uncertain significance. Diameter measures 2.9 cm x 2.9 cm x 4 cm. No internal color flow. IMPRESSION: Sonographic survey of the right lower extremity negative for DVT. Hypoechoic structure lateral to the femoral vasculature at the inguinal region of uncertain significance. Differential includes pathologic lymph nodes as well as paralabral cyst or other musculoskeletal structure. Further evaluation with pelvic CT or MR may be useful. Electronically Signed   By: Corrie Mckusick D.O.   On: 01/02/2019 11:25   DG Knee Complete 4 Views Right   Result Date: 01/02/2019 CLINICAL DATA:  Right knee pain.  No known injury. EXAM: RIGHT KNEE - COMPLETE 4+ VIEW COMPARISON:  None. FINDINGS: Joint space narrowing with chondrocalcinosis. Narrowing most pronounced in the medial compartment. No acute bony abnormality. Specifically, no fracture, subluxation, or dislocation. No joint effusion IMPRESSION: Moderate degenerative changes.  No acute bony abnormality. Electronically Signed   By: Rolm Baptise M.D.   On: 01/02/2019 10:43   DG Hip Unilat W or Wo Pelvis 2-3 Views Right  Result Date: 01/02/2019 CLINICAL DATA:  Right hip pain.  No known injury. EXAM: DG HIP (WITH OR WITHOUT PELVIS) 2-3V RIGHT COMPARISON:  None. FINDINGS: Prior right hip replacement. No hardware complicating feature. No acute bony abnormality. Specifically, no fracture, subluxation, or dislocation. Degenerative changes in the lower lumbar spine. IMPRESSION: Prior right hip replacement.  No acute bony abnormality. Electronically Signed   By: Rolm Baptise M.D.   On: 01/02/2019 10:43    Procedures Procedures (including critical care time)  Medications Ordered in ED Medications - No data to display  ED Course  I have reviewed the triage vital signs and the nursing notes.  Pertinent labs & imaging results that were available during my care of the patient were reviewed by me and considered in my medical decision making (see chart for details).    MDM Rules/Calculators/A&P                      No acute bony abnormality such as fracture, dislocation, etc. seen on x-rays.  Neurovascular intact.  The ultrasound does not show DVT but does show a hypoechoic structure in the inguinal region.  I cannot easily palpate this area on reexamination and there is no obvious cellulitis.  I discussed getting a pelvic CT scan but the patient needs to leave as her ride is leaving.  She does feel like she can get this done as an outpatient with her PCP which I think is probably reasonable but I  discussed the importance of doing this sooner rather than later and calling her PCP on Monday, 1/4.  We discussed possible differential diagnosis. This could be the source of her pain in her hip. Final Clinical Impression(s) / ED Diagnoses Final diagnoses:  Right leg pain    Rx / DC Orders ED Discharge Orders    None       Sherwood Gambler, MD 01/02/19 1144    Sherwood Gambler, MD 01/02/19 1144

## 2019-01-02 NOTE — ED Triage Notes (Signed)
Pt is here from home with worsening bilateral hip pain which is worse on the right than the left.  Pt also has right knee pain.  Pt denies any trauma or recent injury.  Hip replacement on right in 2008.  Pt is alert and oriented, ambulates with a cane.

## 2019-01-02 NOTE — Discharge Instructions (Addendum)
The ultrasound showed a lesion in your right groin that needs further evaluation with CT scan or MRI.  Your primary care doctor can help set this up.  If you develop redness to the skin, new or worsening pain, fever, inability to walk, or any other new/concerning symptoms then return to the ER for evaluation.

## 2019-01-08 DIAGNOSIS — Z23 Encounter for immunization: Secondary | ICD-10-CM | POA: Diagnosis not present

## 2019-01-08 DIAGNOSIS — M25551 Pain in right hip: Secondary | ICD-10-CM | POA: Diagnosis not present

## 2019-01-08 DIAGNOSIS — E782 Mixed hyperlipidemia: Secondary | ICD-10-CM | POA: Diagnosis not present

## 2019-01-08 DIAGNOSIS — E559 Vitamin D deficiency, unspecified: Secondary | ICD-10-CM | POA: Diagnosis not present

## 2019-01-08 DIAGNOSIS — M1710 Unilateral primary osteoarthritis, unspecified knee: Secondary | ICD-10-CM | POA: Diagnosis not present

## 2019-01-08 DIAGNOSIS — J302 Other seasonal allergic rhinitis: Secondary | ICD-10-CM | POA: Diagnosis not present

## 2019-01-08 DIAGNOSIS — E7211 Homocystinuria: Secondary | ICD-10-CM | POA: Diagnosis not present

## 2019-01-08 DIAGNOSIS — I119 Hypertensive heart disease without heart failure: Secondary | ICD-10-CM | POA: Diagnosis not present

## 2019-01-23 DIAGNOSIS — Z96649 Presence of unspecified artificial hip joint: Secondary | ICD-10-CM | POA: Diagnosis not present

## 2019-01-23 DIAGNOSIS — M25551 Pain in right hip: Secondary | ICD-10-CM | POA: Diagnosis not present

## 2019-01-23 DIAGNOSIS — T84038A Mechanical loosening of other internal prosthetic joint, initial encounter: Secondary | ICD-10-CM | POA: Diagnosis not present

## 2019-01-24 DIAGNOSIS — Z96649 Presence of unspecified artificial hip joint: Secondary | ICD-10-CM | POA: Diagnosis not present

## 2019-01-24 DIAGNOSIS — T84038A Mechanical loosening of other internal prosthetic joint, initial encounter: Secondary | ICD-10-CM | POA: Diagnosis not present

## 2019-02-26 DIAGNOSIS — T84038A Mechanical loosening of other internal prosthetic joint, initial encounter: Secondary | ICD-10-CM | POA: Diagnosis not present

## 2019-02-26 DIAGNOSIS — E7211 Homocystinuria: Secondary | ICD-10-CM | POA: Diagnosis not present

## 2019-02-26 DIAGNOSIS — Z96649 Presence of unspecified artificial hip joint: Secondary | ICD-10-CM | POA: Diagnosis not present

## 2019-02-26 DIAGNOSIS — E559 Vitamin D deficiency, unspecified: Secondary | ICD-10-CM | POA: Diagnosis not present

## 2019-02-26 DIAGNOSIS — N1832 Chronic kidney disease, stage 3b: Secondary | ICD-10-CM | POA: Diagnosis not present

## 2019-02-26 DIAGNOSIS — J302 Other seasonal allergic rhinitis: Secondary | ICD-10-CM | POA: Diagnosis not present

## 2019-02-26 DIAGNOSIS — M1710 Unilateral primary osteoarthritis, unspecified knee: Secondary | ICD-10-CM | POA: Diagnosis not present

## 2019-02-26 DIAGNOSIS — E782 Mixed hyperlipidemia: Secondary | ICD-10-CM | POA: Diagnosis not present

## 2019-02-26 DIAGNOSIS — I119 Hypertensive heart disease without heart failure: Secondary | ICD-10-CM | POA: Diagnosis not present

## 2019-03-06 DIAGNOSIS — M25551 Pain in right hip: Secondary | ICD-10-CM | POA: Diagnosis not present

## 2019-03-06 DIAGNOSIS — M79672 Pain in left foot: Secondary | ICD-10-CM | POA: Diagnosis not present

## 2019-03-06 DIAGNOSIS — Z96649 Presence of unspecified artificial hip joint: Secondary | ICD-10-CM | POA: Diagnosis not present

## 2019-03-06 DIAGNOSIS — T84038A Mechanical loosening of other internal prosthetic joint, initial encounter: Secondary | ICD-10-CM | POA: Diagnosis not present

## 2019-03-20 DIAGNOSIS — E7800 Pure hypercholesterolemia, unspecified: Secondary | ICD-10-CM | POA: Diagnosis not present

## 2019-03-20 DIAGNOSIS — H2513 Age-related nuclear cataract, bilateral: Secondary | ICD-10-CM | POA: Diagnosis not present

## 2019-03-20 DIAGNOSIS — H524 Presbyopia: Secondary | ICD-10-CM | POA: Diagnosis not present

## 2019-03-20 DIAGNOSIS — H40009 Preglaucoma, unspecified, unspecified eye: Secondary | ICD-10-CM | POA: Diagnosis not present

## 2019-03-20 DIAGNOSIS — H35033 Hypertensive retinopathy, bilateral: Secondary | ICD-10-CM | POA: Diagnosis not present

## 2019-03-21 DIAGNOSIS — Z96649 Presence of unspecified artificial hip joint: Secondary | ICD-10-CM | POA: Diagnosis not present

## 2019-03-21 DIAGNOSIS — T84038A Mechanical loosening of other internal prosthetic joint, initial encounter: Secondary | ICD-10-CM | POA: Diagnosis not present

## 2019-04-16 DIAGNOSIS — Z0100 Encounter for examination of eyes and vision without abnormal findings: Secondary | ICD-10-CM | POA: Diagnosis not present

## 2019-04-29 DIAGNOSIS — I10 Essential (primary) hypertension: Secondary | ICD-10-CM | POA: Diagnosis not present

## 2019-04-29 DIAGNOSIS — N183 Chronic kidney disease, stage 3 unspecified: Secondary | ICD-10-CM | POA: Diagnosis not present

## 2019-04-30 DIAGNOSIS — N390 Urinary tract infection, site not specified: Secondary | ICD-10-CM | POA: Diagnosis not present

## 2019-04-30 DIAGNOSIS — E559 Vitamin D deficiency, unspecified: Secondary | ICD-10-CM | POA: Diagnosis not present

## 2019-04-30 DIAGNOSIS — N183 Chronic kidney disease, stage 3 unspecified: Secondary | ICD-10-CM | POA: Diagnosis not present

## 2019-04-30 DIAGNOSIS — I10 Essential (primary) hypertension: Secondary | ICD-10-CM | POA: Diagnosis not present

## 2019-05-05 DIAGNOSIS — Z0001 Encounter for general adult medical examination with abnormal findings: Secondary | ICD-10-CM | POA: Diagnosis not present

## 2019-05-05 DIAGNOSIS — E7211 Homocystinuria: Secondary | ICD-10-CM | POA: Diagnosis not present

## 2019-05-05 DIAGNOSIS — Z1389 Encounter for screening for other disorder: Secondary | ICD-10-CM | POA: Diagnosis not present

## 2019-05-05 DIAGNOSIS — Z136 Encounter for screening for cardiovascular disorders: Secondary | ICD-10-CM | POA: Diagnosis not present

## 2019-05-05 DIAGNOSIS — Z131 Encounter for screening for diabetes mellitus: Secondary | ICD-10-CM | POA: Diagnosis not present

## 2019-05-05 DIAGNOSIS — Z1329 Encounter for screening for other suspected endocrine disorder: Secondary | ICD-10-CM | POA: Diagnosis not present

## 2019-05-05 DIAGNOSIS — M1710 Unilateral primary osteoarthritis, unspecified knee: Secondary | ICD-10-CM | POA: Diagnosis not present

## 2019-05-05 DIAGNOSIS — E782 Mixed hyperlipidemia: Secondary | ICD-10-CM | POA: Diagnosis not present

## 2019-05-05 DIAGNOSIS — N1832 Chronic kidney disease, stage 3b: Secondary | ICD-10-CM | POA: Diagnosis not present

## 2019-05-28 DIAGNOSIS — H401130 Primary open-angle glaucoma, bilateral, stage unspecified: Secondary | ICD-10-CM | POA: Diagnosis not present

## 2019-06-18 DIAGNOSIS — N1832 Chronic kidney disease, stage 3b: Secondary | ICD-10-CM | POA: Diagnosis not present

## 2019-06-18 DIAGNOSIS — E782 Mixed hyperlipidemia: Secondary | ICD-10-CM | POA: Diagnosis not present

## 2019-06-18 DIAGNOSIS — E559 Vitamin D deficiency, unspecified: Secondary | ICD-10-CM | POA: Diagnosis not present

## 2019-06-18 DIAGNOSIS — M546 Pain in thoracic spine: Secondary | ICD-10-CM | POA: Diagnosis not present

## 2019-06-18 DIAGNOSIS — I119 Hypertensive heart disease without heart failure: Secondary | ICD-10-CM | POA: Diagnosis not present

## 2019-06-18 DIAGNOSIS — E7211 Homocystinuria: Secondary | ICD-10-CM | POA: Diagnosis not present

## 2019-07-14 DIAGNOSIS — I119 Hypertensive heart disease without heart failure: Secondary | ICD-10-CM | POA: Diagnosis not present

## 2019-07-14 DIAGNOSIS — M10371 Gout due to renal impairment, right ankle and foot: Secondary | ICD-10-CM | POA: Diagnosis not present

## 2019-07-14 DIAGNOSIS — E782 Mixed hyperlipidemia: Secondary | ICD-10-CM | POA: Diagnosis not present

## 2019-07-14 DIAGNOSIS — E7211 Homocystinuria: Secondary | ICD-10-CM | POA: Diagnosis not present

## 2019-07-14 DIAGNOSIS — N1832 Chronic kidney disease, stage 3b: Secondary | ICD-10-CM | POA: Diagnosis not present

## 2019-07-14 DIAGNOSIS — Z0001 Encounter for general adult medical examination with abnormal findings: Secondary | ICD-10-CM | POA: Diagnosis not present

## 2019-07-14 DIAGNOSIS — E559 Vitamin D deficiency, unspecified: Secondary | ICD-10-CM | POA: Diagnosis not present

## 2019-10-17 DIAGNOSIS — I119 Hypertensive heart disease without heart failure: Secondary | ICD-10-CM | POA: Diagnosis not present

## 2019-10-17 DIAGNOSIS — E559 Vitamin D deficiency, unspecified: Secondary | ICD-10-CM | POA: Diagnosis not present

## 2019-10-17 DIAGNOSIS — R0602 Shortness of breath: Secondary | ICD-10-CM | POA: Diagnosis not present

## 2019-10-17 DIAGNOSIS — N1832 Chronic kidney disease, stage 3b: Secondary | ICD-10-CM | POA: Diagnosis not present

## 2019-10-17 DIAGNOSIS — E782 Mixed hyperlipidemia: Secondary | ICD-10-CM | POA: Diagnosis not present

## 2019-10-17 DIAGNOSIS — E7211 Homocystinuria: Secondary | ICD-10-CM | POA: Diagnosis not present

## 2019-10-24 DIAGNOSIS — I119 Hypertensive heart disease without heart failure: Secondary | ICD-10-CM | POA: Diagnosis not present

## 2019-10-30 DIAGNOSIS — E559 Vitamin D deficiency, unspecified: Secondary | ICD-10-CM | POA: Diagnosis not present

## 2019-10-30 DIAGNOSIS — I10 Essential (primary) hypertension: Secondary | ICD-10-CM | POA: Diagnosis not present

## 2019-10-30 DIAGNOSIS — N183 Chronic kidney disease, stage 3 unspecified: Secondary | ICD-10-CM | POA: Diagnosis not present

## 2019-10-31 DIAGNOSIS — I119 Hypertensive heart disease without heart failure: Secondary | ICD-10-CM | POA: Diagnosis not present

## 2019-10-31 DIAGNOSIS — I10 Essential (primary) hypertension: Secondary | ICD-10-CM | POA: Diagnosis not present

## 2019-11-04 DIAGNOSIS — N390 Urinary tract infection, site not specified: Secondary | ICD-10-CM | POA: Diagnosis not present

## 2019-11-04 DIAGNOSIS — N183 Chronic kidney disease, stage 3 unspecified: Secondary | ICD-10-CM | POA: Diagnosis not present

## 2019-11-04 DIAGNOSIS — E559 Vitamin D deficiency, unspecified: Secondary | ICD-10-CM | POA: Diagnosis not present

## 2019-11-04 DIAGNOSIS — I10 Essential (primary) hypertension: Secondary | ICD-10-CM | POA: Diagnosis not present

## 2019-12-12 DIAGNOSIS — N1832 Chronic kidney disease, stage 3b: Secondary | ICD-10-CM | POA: Diagnosis not present

## 2019-12-12 DIAGNOSIS — Z23 Encounter for immunization: Secondary | ICD-10-CM | POA: Diagnosis not present

## 2019-12-12 DIAGNOSIS — E782 Mixed hyperlipidemia: Secondary | ICD-10-CM | POA: Diagnosis not present

## 2019-12-12 DIAGNOSIS — E7211 Homocystinuria: Secondary | ICD-10-CM | POA: Diagnosis not present

## 2019-12-12 DIAGNOSIS — E559 Vitamin D deficiency, unspecified: Secondary | ICD-10-CM | POA: Diagnosis not present

## 2019-12-12 DIAGNOSIS — I119 Hypertensive heart disease without heart failure: Secondary | ICD-10-CM | POA: Diagnosis not present

## 2020-03-24 DIAGNOSIS — M25561 Pain in right knee: Secondary | ICD-10-CM | POA: Diagnosis not present

## 2020-03-24 DIAGNOSIS — M1711 Unilateral primary osteoarthritis, right knee: Secondary | ICD-10-CM | POA: Diagnosis not present

## 2020-05-03 ENCOUNTER — Other Ambulatory Visit: Payer: Self-pay

## 2020-05-03 ENCOUNTER — Other Ambulatory Visit: Payer: Self-pay | Admitting: Internal Medicine

## 2020-05-03 ENCOUNTER — Ambulatory Visit
Admission: RE | Admit: 2020-05-03 | Discharge: 2020-05-03 | Disposition: A | Discharge status: Home / Self Care | Payer: Medicare HMO | Source: Ambulatory Visit | Attending: Internal Medicine | Admitting: Internal Medicine

## 2020-05-03 DIAGNOSIS — Z136 Encounter for screening for cardiovascular disorders: Secondary | ICD-10-CM | POA: Diagnosis not present

## 2020-05-03 DIAGNOSIS — Z1231 Encounter for screening mammogram for malignant neoplasm of breast: Secondary | ICD-10-CM | POA: Diagnosis not present

## 2020-05-03 DIAGNOSIS — N1832 Chronic kidney disease, stage 3b: Secondary | ICD-10-CM | POA: Diagnosis not present

## 2020-05-03 DIAGNOSIS — R7303 Prediabetes: Secondary | ICD-10-CM | POA: Diagnosis not present

## 2020-05-03 DIAGNOSIS — Z0001 Encounter for general adult medical examination with abnormal findings: Secondary | ICD-10-CM | POA: Diagnosis not present

## 2020-05-03 DIAGNOSIS — E7211 Homocystinuria: Secondary | ICD-10-CM | POA: Diagnosis not present

## 2020-05-03 DIAGNOSIS — N183 Chronic kidney disease, stage 3 unspecified: Secondary | ICD-10-CM | POA: Diagnosis not present

## 2020-05-03 DIAGNOSIS — I10 Essential (primary) hypertension: Secondary | ICD-10-CM | POA: Diagnosis not present

## 2020-05-03 DIAGNOSIS — Z131 Encounter for screening for diabetes mellitus: Secondary | ICD-10-CM | POA: Diagnosis not present

## 2020-05-03 DIAGNOSIS — I119 Hypertensive heart disease without heart failure: Secondary | ICD-10-CM | POA: Diagnosis not present

## 2020-05-03 DIAGNOSIS — E782 Mixed hyperlipidemia: Secondary | ICD-10-CM | POA: Diagnosis not present

## 2020-05-03 DIAGNOSIS — E559 Vitamin D deficiency, unspecified: Secondary | ICD-10-CM | POA: Diagnosis not present

## 2020-05-04 DIAGNOSIS — E559 Vitamin D deficiency, unspecified: Secondary | ICD-10-CM | POA: Diagnosis not present

## 2020-05-04 DIAGNOSIS — N183 Chronic kidney disease, stage 3 unspecified: Secondary | ICD-10-CM | POA: Diagnosis not present

## 2020-05-04 DIAGNOSIS — I10 Essential (primary) hypertension: Secondary | ICD-10-CM | POA: Diagnosis not present

## 2020-05-04 DIAGNOSIS — N390 Urinary tract infection, site not specified: Secondary | ICD-10-CM | POA: Diagnosis not present

## 2020-05-06 DIAGNOSIS — N183 Chronic kidney disease, stage 3 unspecified: Secondary | ICD-10-CM | POA: Diagnosis not present

## 2020-05-06 DIAGNOSIS — I10 Essential (primary) hypertension: Secondary | ICD-10-CM | POA: Diagnosis not present

## 2020-05-06 DIAGNOSIS — R809 Proteinuria, unspecified: Secondary | ICD-10-CM | POA: Diagnosis not present

## 2020-05-06 DIAGNOSIS — E559 Vitamin D deficiency, unspecified: Secondary | ICD-10-CM | POA: Diagnosis not present

## 2020-05-18 DIAGNOSIS — E559 Vitamin D deficiency, unspecified: Secondary | ICD-10-CM | POA: Diagnosis not present

## 2020-05-18 DIAGNOSIS — I10 Essential (primary) hypertension: Secondary | ICD-10-CM | POA: Diagnosis not present

## 2020-05-18 DIAGNOSIS — R809 Proteinuria, unspecified: Secondary | ICD-10-CM | POA: Diagnosis not present

## 2020-05-18 DIAGNOSIS — N183 Chronic kidney disease, stage 3 unspecified: Secondary | ICD-10-CM | POA: Diagnosis not present

## 2020-05-25 DIAGNOSIS — E7800 Pure hypercholesterolemia, unspecified: Secondary | ICD-10-CM | POA: Diagnosis not present

## 2020-05-25 DIAGNOSIS — H35033 Hypertensive retinopathy, bilateral: Secondary | ICD-10-CM | POA: Diagnosis not present

## 2020-05-25 DIAGNOSIS — H401313 Pigmentary glaucoma, right eye, severe stage: Secondary | ICD-10-CM | POA: Diagnosis not present

## 2020-05-25 DIAGNOSIS — Z0100 Encounter for examination of eyes and vision without abnormal findings: Secondary | ICD-10-CM | POA: Diagnosis not present

## 2020-05-25 DIAGNOSIS — H524 Presbyopia: Secondary | ICD-10-CM | POA: Diagnosis not present

## 2020-05-25 DIAGNOSIS — H2513 Age-related nuclear cataract, bilateral: Secondary | ICD-10-CM | POA: Diagnosis not present

## 2020-05-25 DIAGNOSIS — H40009 Preglaucoma, unspecified, unspecified eye: Secondary | ICD-10-CM | POA: Diagnosis not present

## 2020-07-13 DIAGNOSIS — Z0001 Encounter for general adult medical examination with abnormal findings: Secondary | ICD-10-CM | POA: Diagnosis not present

## 2020-07-13 DIAGNOSIS — I119 Hypertensive heart disease without heart failure: Secondary | ICD-10-CM | POA: Diagnosis not present

## 2020-07-13 DIAGNOSIS — N1832 Chronic kidney disease, stage 3b: Secondary | ICD-10-CM | POA: Diagnosis not present

## 2020-07-13 DIAGNOSIS — E559 Vitamin D deficiency, unspecified: Secondary | ICD-10-CM | POA: Diagnosis not present

## 2020-07-13 DIAGNOSIS — E782 Mixed hyperlipidemia: Secondary | ICD-10-CM | POA: Diagnosis not present

## 2020-07-13 DIAGNOSIS — E7211 Homocystinuria: Secondary | ICD-10-CM | POA: Diagnosis not present

## 2020-07-13 DIAGNOSIS — K5900 Constipation, unspecified: Secondary | ICD-10-CM | POA: Diagnosis not present

## 2020-08-26 ENCOUNTER — Emergency Department (HOSPITAL_BASED_OUTPATIENT_CLINIC_OR_DEPARTMENT_OTHER)
Admission: EM | Admit: 2020-08-26 | Discharge: 2020-08-26 | Disposition: A | Discharge status: Home / Self Care | Payer: Medicare HMO | Attending: Emergency Medicine | Admitting: Emergency Medicine

## 2020-08-26 ENCOUNTER — Emergency Department (HOSPITAL_BASED_OUTPATIENT_CLINIC_OR_DEPARTMENT_OTHER): Payer: Medicare HMO

## 2020-08-26 ENCOUNTER — Other Ambulatory Visit: Payer: Self-pay

## 2020-08-26 ENCOUNTER — Encounter (HOSPITAL_BASED_OUTPATIENT_CLINIC_OR_DEPARTMENT_OTHER): Payer: Self-pay

## 2020-08-26 DIAGNOSIS — R0789 Other chest pain: Secondary | ICD-10-CM | POA: Insufficient documentation

## 2020-08-26 DIAGNOSIS — M25511 Pain in right shoulder: Secondary | ICD-10-CM | POA: Diagnosis present

## 2020-08-26 DIAGNOSIS — R109 Unspecified abdominal pain: Secondary | ICD-10-CM

## 2020-08-26 DIAGNOSIS — R059 Cough, unspecified: Secondary | ICD-10-CM | POA: Insufficient documentation

## 2020-08-26 DIAGNOSIS — I10 Essential (primary) hypertension: Secondary | ICD-10-CM | POA: Diagnosis not present

## 2020-08-26 DIAGNOSIS — K8020 Calculus of gallbladder without cholecystitis without obstruction: Secondary | ICD-10-CM | POA: Diagnosis not present

## 2020-08-26 DIAGNOSIS — R051 Acute cough: Secondary | ICD-10-CM | POA: Diagnosis not present

## 2020-08-26 DIAGNOSIS — R079 Chest pain, unspecified: Secondary | ICD-10-CM | POA: Diagnosis not present

## 2020-08-26 DIAGNOSIS — Z96649 Presence of unspecified artificial hip joint: Secondary | ICD-10-CM | POA: Insufficient documentation

## 2020-08-26 LAB — CBC
HCT: 49.8 % — ABNORMAL HIGH (ref 36.0–46.0)
Hemoglobin: 16.2 g/dL — ABNORMAL HIGH (ref 12.0–15.0)
MCH: 25.4 pg — ABNORMAL LOW (ref 26.0–34.0)
MCHC: 32.5 g/dL (ref 30.0–36.0)
MCV: 77.9 fL — ABNORMAL LOW (ref 80.0–100.0)
Platelets: 481 10*3/uL — ABNORMAL HIGH (ref 150–400)
RBC: 6.39 MIL/uL — ABNORMAL HIGH (ref 3.87–5.11)
RDW: 17 % — ABNORMAL HIGH (ref 11.5–15.5)
WBC: 9.2 10*3/uL (ref 4.0–10.5)
nRBC: 0 % (ref 0.0–0.2)

## 2020-08-26 LAB — BASIC METABOLIC PANEL
Anion gap: 13 (ref 5–15)
BUN: 19 mg/dL (ref 8–23)
CO2: 27 mmol/L (ref 22–32)
Calcium: 9.9 mg/dL (ref 8.9–10.3)
Chloride: 98 mmol/L (ref 98–111)
Creatinine, Ser: 1.66 mg/dL — ABNORMAL HIGH (ref 0.44–1.00)
GFR, Estimated: 30 mL/min — ABNORMAL LOW (ref >60)
Glucose, Bld: 96 mg/dL (ref 70–99)
Potassium: 3.9 mmol/L (ref 3.5–5.1)
Sodium: 138 mmol/L (ref 135–145)

## 2020-08-26 LAB — LIPASE, BLOOD: Lipase: 79 U/L — ABNORMAL HIGH (ref 11–51)

## 2020-08-26 LAB — HEPATIC FUNCTION PANEL
ALT: 31 U/L (ref 0–44)
AST: 44 U/L — ABNORMAL HIGH (ref 15–41)
Albumin: 4.6 g/dL (ref 3.5–5.0)
Alkaline Phosphatase: 86 U/L (ref 38–126)
Bilirubin, Direct: 0.1 mg/dL (ref 0.0–0.2)
Indirect Bilirubin: 0.6 mg/dL (ref 0.3–0.9)
Total Bilirubin: 0.7 mg/dL (ref 0.3–1.2)
Total Protein: 8.6 g/dL — ABNORMAL HIGH (ref 6.5–8.1)

## 2020-08-26 LAB — TROPONIN I (HIGH SENSITIVITY)
Troponin I (High Sensitivity): 6 ng/L (ref <18)
Troponin I (High Sensitivity): 6 ng/L (ref <18)

## 2020-08-26 MED ORDER — KETOROLAC TROMETHAMINE 30 MG/ML IJ SOLN
30.0000 mg | Freq: Once | INTRAMUSCULAR | Status: AC
  Administered 2020-08-26: 30 mg via INTRAVENOUS
  Filled 2020-08-26: qty 1

## 2020-08-26 NOTE — ED Notes (Signed)
ED Provider at bedside. 

## 2020-08-26 NOTE — ED Provider Notes (Signed)
[facility] EMERGENCY DEPARTMENT Provider Note   CSN: [id number] Arrival date & time: 08/26/20  1234     History Chief Complaint  Patient presents with   Chest Pain    Kimberly Travis is a 85 y.o. female with a history of hypertension, hyperlipidemia, presenting to emergency department with bilateral chest pain and shoulder pain.  Patient reports symptoms been ongoing for about a week.  She reports intermittent sharp pains in the right lower chest wall, which are stabbing in nature, occasionally feels it on the left side as well.  Today she was concerned because she was feeling the same types of pain in her left shoulder.  These will last a few minutes at a time, then go away.  Nothing makes them better or worse.  They are not associated with inspiration, movement.  They are not worse after eating.  She denies any chest pressure, shortness of breath, lightheadedness.  She denies any numbness or weakness.  She denies any nausea, vomiting, diarrhea.  She does struggle with constipation and had a bowel movement yesterday with the help of a laxative.  She says she has always struggled with constipation.  She does report a dry cough this week, which she says is unusual.  She reports a tickle in the back of her throat.  She denies sore throat, headache, chills.  She does not smoke.  She denies any history of diabetes.  Denies any history of MI or cardiac disease.  HPI     Past Medical History:  Diagnosis Date   Gout    High cholesterol    Hypertension     There are no problems to display for this patient.   Past Surgical History:  Procedure Laterality Date   TOTAL HIP ARTHROPLASTY       OB History   No obstetric history on file.     No family history on file.  Social History   Tobacco Use   Smoking status: Never   Smokeless tobacco: Never  Substance Use Topics   Alcohol use: Yes    Comment: socially   Drug use: Never    Home Medications Prior to Admission  medications   Not on File    Allergies    Hydrocodone-acetaminophen  Review of Systems   Review of Systems  Constitutional:  Negative for chills and fever.  Eyes:  Negative for pain and visual disturbance.  Respiratory:  Positive for cough. Negative for shortness of breath.   Cardiovascular:  Positive for chest pain. Negative for palpitations.  Gastrointestinal:  Positive for abdominal pain. Negative for diarrhea, nausea and vomiting.  Genitourinary:  Negative for dysuria and hematuria.  Musculoskeletal:  Positive for back pain. Negative for arthralgias.  Skin:  Negative for color change and rash.  Neurological:  Negative for seizures and syncope.  All other systems reviewed and are negative.  Physical Exam Updated Vital Signs BP (!) 146/67   Pulse 66   Temp 98.3 F (36.8 C) (Oral)   Resp 18   Ht 5\' 5"  (1.651 m)   Wt 77.1 kg   SpO2 99%   BMI 28.29 kg/m   Physical Exam Constitutional:      General: She is not in acute distress. HENT:     Head: Normocephalic and atraumatic.  Eyes:     Conjunctiva/sclera: Conjunctivae normal.     Pupils: Pupils are equal, round, and reactive to light.  Cardiovascular:     Rate and Rhythm: Normal rate and regular rhythm.  Pulmonary:     Effort: Pulmonary effort is normal. No respiratory distress.  Abdominal:     General: There is no distension.     Tenderness: There is no abdominal tenderness. There is no right CVA tenderness, left CVA tenderness, guarding or rebound. Negative signs include Murphy's sign and McBurney's sign.  Skin:    General: Skin is warm and dry.  Neurological:     General: No focal deficit present.     Mental Status: She is alert. Mental status is at baseline.  Psychiatric:        Mood and Affect: Mood normal.        Behavior: Behavior normal.    ED Results / Procedures / Treatments   Labs (all labs ordered are listed, but only abnormal results are displayed) Labs Reviewed  BASIC METABOLIC PANEL -  Abnormal; Notable for the following components:      Result Value   Creatinine, Ser 1.66 (*)    GFR, Estimated 30 (*)    All other components within normal limits  CBC - Abnormal; Notable for the following components:   RBC 6.39 (*)    Hemoglobin 16.2 (*)    HCT 49.8 (*)    MCV 77.9 (*)    MCH 25.4 (*)    RDW 17.0 (*)    Platelets 481 (*)    All other components within normal limits  HEPATIC FUNCTION PANEL - Abnormal; Notable for the following components:   Total Protein 8.6 (*)    AST 44 (*)    All other components within normal limits  LIPASE, BLOOD - Abnormal; Notable for the following components:   Lipase 79 (*)    All other components within normal limits  TROPONIN I (HIGH SENSITIVITY)  TROPONIN I (HIGH SENSITIVITY)    EKG EKG Interpretation  Date/Time:  Thursday August 26 2020 12:47:10 EDT Ventricular Rate:  72 PR Interval:  153 QRS Duration: 85 QT Interval:  388 QTC Calculation: 425 R Axis:   38 Text Interpretation: Sinus rhythm Abnormal R-wave progression, early transition Confirmed by Octaviano Glow [phone]) on 08/26/2020 12:48:05 PM Also confirmed by Octaviano Glow [phone]), editor Hattie Perch [phone])  on 08/26/2020 2:52:46 PM  Radiology DG Chest 2 View  Result Date: 08/26/2020 CLINICAL DATA:  Chest pain EXAM: CHEST - 2 VIEW COMPARISON:  2015 FINDINGS: The heart size and mediastinal contours are within normal limits. Both lungs are clear. Right hilar postoperative changes. No pleural effusion or pneumothorax. The visualized skeletal structures are unremarkable. IMPRESSION: No acute process in the chest. Electronically Signed   By: Macy Mis M.D.   On: 08/26/2020 13:34   US Abdomen Limited RUQ (LIVER/GB)  Result Date: 08/26/2020 CLINICAL DATA:  Abdominal pain EXAM: ULTRASOUND ABDOMEN LIMITED RIGHT UPPER QUADRANT COMPARISON:  None. FINDINGS: Gallbladder: The gallbladder is contracted. There is heterogeneously echoic material within the contracted  gallbladder without internal vascularity. The gallbladder wall appears mildly thickened measuring up to 4 mm, this is likely exaggerated by under distension. No shadowing stones are identified. There was no sonographic Murphy sign. Common bile duct: Diameter: 3 mm Liver: No focal lesion identified. Within normal limits in parenchymal echogenicity. Portal vein is patent on color Doppler imaging with normal direction of blood flow towards the liver. Other: None. IMPRESSION: Echogenic material within the contracted gallbladder most likely reflects sludge or poorly calcified stones. No shadowing stones or evidence of acute cholecystitis. Electronically Signed   By: Valetta Mole M.D.   On: 08/26/2020 15:38  Procedures Procedures   Medications Ordered in ED Medications  ketorolac (TORADOL) 30 MG/ML injection 30 mg (30 mg Intravenous Given 08/26/20 1422)    ED Course  I have reviewed the triage vital signs and the nursing notes.  Pertinent labs & imaging results that were available during my care of the patient were reviewed by me and considered in my medical decision making (see chart for details).   This patient complains of bilateral lower chest pain and left shoulder pain.  This involves an extensive number of treatment options, and is a complaint that carries with it a high risk of complications and morbidity.  The differential diagnosis includes constipation versus gastritis first pneumonia versus viral illness versus atypical ACS versus other.  Suspect her shoulder pain is most likely referred pain.  It is possible that she is got some diaphragmatic irritation.  We will check a chest x-ray for pneumonia.  I will check CBC and LFTs.  She does not have focal right upper quadrant tenderness, but if no other source is found, we may consider a gallbladder ultrasound.  Reviewed her EKG on arrival.  Shows normal sinus rhythm.  She has no cardiac history, this presentation is quite atypical for ACS.   Symptoms are nonexertional.  I ordered, reviewed, and interpreted labs.  No life-threatening abnormalities were noted on these tests.  Cr at baseline 1.6.  Lipase mildly elevated at 79. I ordered medication toradol for pain I ordered imaging studies which included RUQ ultrasound, DG chest I independently visualized and interpreted imaging which showed no life-threatening abnormalities of the chest, U/S with gallstones and normal CBD diameter, and the monitor tracing which showed NSR  ECG with NSR per my review, no acute ischemic findings.  Delta troponins low and flat.  I have a lower suspicion for ACS, PE, aortic dissection, PTX, or other life-threatening emergency based on this workup.   Clinical Course as of 08/26/20 1719  Thu Aug 26, 2020  1333 Lipase(!): 79 [MT]  1333 Troponin I (High Sensitivity): 6 [MT]  1408 Very mild elevation in lipase.  LFTs are normal.  However with the description and referral of her pain, I think is reasonable in her right upper quadrant ultrasound to evaluate for CBD dilation or gallstones.  Patient remains comfortable and minimally symptomatic. [MT]  7322 Patient has gallstones, no other evidence of acute cholecystitis.  No dilation of the common bile duct. [MT]  0254 Her pain is minimal at this time, I think it is reasonable to refer her to a general surgeon for evaluation of her gallbladder.  We will also start her on a low-fat and light diet. [MT]  1545 However as she is not having intractable biliary colic, I think it is reasonable to discharge her home. [MT]    Clinical Course User Index [MT] Daven Montz, Carola Rhine, MD    Final Clinical Impression(s) / ED Diagnoses Final diagnoses:  Abdominal pain  Calculus of gallbladder without cholecystitis without obstruction    Rx / DC Orders ED Discharge Orders     None        Wyvonnia Dusky, MD 08/26/20 [phone]

## 2020-08-26 NOTE — ED Triage Notes (Signed)
Pt arrives to ED with c/o left sided CP starting about 5 days ago reports pain to left chest, under breast, and in left arm. Some pain in right side and back as well. Denies any radiation to neck or jaw. Pt c/o some SOB, being "off balance", and sweating.

## 2020-08-26 NOTE — Discharge Instructions (Addendum)
We talked about your work-up today.  We saw signs of gallstones on your ultrasound, but no signs of inflammation around the gallbladder.  I recommended that you call and schedule a follow-up appointment with a general surgeon for this.  They will see you in the office.  Sometimes they will talk to you about removing your gallbladder, which may still be squeezing and giving you pain after eating.  I recommend that you stick to the gallbladder diet that I included.  This is a low-fat, low grease diet, to prevent your gallbladder from squeezing and cramping.  We also talked about your chest pain. Your blood test and your EKG did not show signs of a heart attack at this time.  However, you do have risk factors for heart disease.  I recommend that you follow-up with your cardiologist at [facility], and call tomorrow morning to schedule an appointment.  You can continue taking Tylenol regularly for pain, and Aleve sparingly as needed.  If your pain gets worse, or if you begin having shortness of breath, lightheadedness, dizziness, difficulty eating or swallowing, difficulty keeping down fluids, please come back to the ER immediately.

## 2020-08-30 DIAGNOSIS — R809 Proteinuria, unspecified: Secondary | ICD-10-CM | POA: Diagnosis not present

## 2020-08-30 DIAGNOSIS — N183 Chronic kidney disease, stage 3 unspecified: Secondary | ICD-10-CM | POA: Diagnosis not present

## 2020-08-31 DIAGNOSIS — N1832 Chronic kidney disease, stage 3b: Secondary | ICD-10-CM | POA: Diagnosis not present

## 2020-08-31 DIAGNOSIS — E782 Mixed hyperlipidemia: Secondary | ICD-10-CM | POA: Diagnosis not present

## 2020-08-31 DIAGNOSIS — N390 Urinary tract infection, site not specified: Secondary | ICD-10-CM | POA: Diagnosis not present

## 2020-08-31 DIAGNOSIS — K8020 Calculus of gallbladder without cholecystitis without obstruction: Secondary | ICD-10-CM | POA: Diagnosis not present

## 2020-08-31 DIAGNOSIS — E7211 Homocystinuria: Secondary | ICD-10-CM | POA: Diagnosis not present

## 2020-08-31 DIAGNOSIS — I10 Essential (primary) hypertension: Secondary | ICD-10-CM | POA: Diagnosis not present

## 2020-08-31 DIAGNOSIS — N183 Chronic kidney disease, stage 3 unspecified: Secondary | ICD-10-CM | POA: Diagnosis not present

## 2020-08-31 DIAGNOSIS — I119 Hypertensive heart disease without heart failure: Secondary | ICD-10-CM | POA: Diagnosis not present

## 2020-08-31 DIAGNOSIS — R079 Chest pain, unspecified: Secondary | ICD-10-CM | POA: Diagnosis not present

## 2020-08-31 DIAGNOSIS — E559 Vitamin D deficiency, unspecified: Secondary | ICD-10-CM | POA: Diagnosis not present

## 2020-09-08 ENCOUNTER — Emergency Department (HOSPITAL_BASED_OUTPATIENT_CLINIC_OR_DEPARTMENT_OTHER)
Admission: EM | Admit: 2020-09-08 | Discharge: 2020-09-08 | Disposition: A | Discharge status: Home / Self Care | Payer: Medicare HMO | Attending: Emergency Medicine | Admitting: Emergency Medicine

## 2020-09-08 ENCOUNTER — Encounter (HOSPITAL_BASED_OUTPATIENT_CLINIC_OR_DEPARTMENT_OTHER): Payer: Self-pay

## 2020-09-08 ENCOUNTER — Emergency Department (HOSPITAL_BASED_OUTPATIENT_CLINIC_OR_DEPARTMENT_OTHER): Payer: Medicare HMO

## 2020-09-08 ENCOUNTER — Other Ambulatory Visit: Payer: Self-pay

## 2020-09-08 DIAGNOSIS — R0602 Shortness of breath: Secondary | ICD-10-CM | POA: Diagnosis not present

## 2020-09-08 DIAGNOSIS — I10 Essential (primary) hypertension: Secondary | ICD-10-CM | POA: Diagnosis not present

## 2020-09-08 DIAGNOSIS — R0789 Other chest pain: Secondary | ICD-10-CM | POA: Diagnosis not present

## 2020-09-08 DIAGNOSIS — I700 Atherosclerosis of aorta: Secondary | ICD-10-CM | POA: Diagnosis not present

## 2020-09-08 DIAGNOSIS — R079 Chest pain, unspecified: Secondary | ICD-10-CM | POA: Diagnosis not present

## 2020-09-08 LAB — COMPREHENSIVE METABOLIC PANEL
ALT: 19 U/L (ref 0–44)
AST: 27 U/L (ref 15–41)
Albumin: 4.6 g/dL (ref 3.5–5.0)
Alkaline Phosphatase: 82 U/L (ref 38–126)
Anion gap: 11 (ref 5–15)
BUN: 20 mg/dL (ref 8–23)
CO2: 26 mmol/L (ref 22–32)
Calcium: 10.1 mg/dL (ref 8.9–10.3)
Chloride: 101 mmol/L (ref 98–111)
Creatinine, Ser: 1.59 mg/dL — ABNORMAL HIGH (ref 0.44–1.00)
GFR, Estimated: 32 mL/min — ABNORMAL LOW (ref >60)
Glucose, Bld: 89 mg/dL (ref 70–99)
Potassium: 3.9 mmol/L (ref 3.5–5.1)
Sodium: 138 mmol/L (ref 135–145)
Total Bilirubin: 0.5 mg/dL (ref 0.3–1.2)
Total Protein: 8.2 g/dL — ABNORMAL HIGH (ref 6.5–8.1)

## 2020-09-08 LAB — CBC WITH DIFFERENTIAL/PLATELET
Abs Immature Granulocytes: 0.19 10*3/uL — ABNORMAL HIGH (ref 0.00–0.07)
Basophils Absolute: 0.2 10*3/uL — ABNORMAL HIGH (ref 0.0–0.1)
Basophils Relative: 2 %
Eosinophils Absolute: 0.1 10*3/uL (ref 0.0–0.5)
Eosinophils Relative: 1 %
HCT: 50.3 % — ABNORMAL HIGH (ref 36.0–46.0)
Hemoglobin: 16.6 g/dL — ABNORMAL HIGH (ref 12.0–15.0)
Immature Granulocytes: 2 %
Lymphocytes Relative: 28 %
Lymphs Abs: 2.6 10*3/uL (ref 0.7–4.0)
MCH: 25.2 pg — ABNORMAL LOW (ref 26.0–34.0)
MCHC: 33 g/dL (ref 30.0–36.0)
MCV: 76.3 fL — ABNORMAL LOW (ref 80.0–100.0)
Monocytes Absolute: 1.6 10*3/uL — ABNORMAL HIGH (ref 0.1–1.0)
Monocytes Relative: 17 %
Neutro Abs: 4.9 10*3/uL (ref 1.7–7.7)
Neutrophils Relative %: 50 %
Platelets: 496 10*3/uL — ABNORMAL HIGH (ref 150–400)
RBC: 6.59 MIL/uL — ABNORMAL HIGH (ref 3.87–5.11)
RDW: 17.8 % — ABNORMAL HIGH (ref 11.5–15.5)
WBC: 9.5 10*3/uL (ref 4.0–10.5)
nRBC: 0 % (ref 0.0–0.2)

## 2020-09-08 LAB — MAGNESIUM: Magnesium: 2 mg/dL (ref 1.7–2.4)

## 2020-09-08 LAB — URINALYSIS, MICROSCOPIC (REFLEX)

## 2020-09-08 LAB — URINALYSIS, ROUTINE W REFLEX MICROSCOPIC
Bilirubin Urine: NEGATIVE
Glucose, UA: NEGATIVE mg/dL
Hgb urine dipstick: NEGATIVE
Ketones, ur: NEGATIVE mg/dL
Leukocytes,Ua: NEGATIVE
Nitrite: NEGATIVE
Protein, ur: 100 mg/dL — AB
Specific Gravity, Urine: 1.015 (ref 1.005–1.030)
pH: 5 (ref 5.0–8.0)

## 2020-09-08 LAB — TROPONIN I (HIGH SENSITIVITY)
Troponin I (High Sensitivity): 6 ng/L (ref <18)
Troponin I (High Sensitivity): 8 ng/L (ref <18)

## 2020-09-08 LAB — PROTIME-INR
INR: 1 (ref 0.8–1.2)
Prothrombin Time: 13.4 seconds (ref 11.4–15.2)

## 2020-09-08 LAB — APTT: aPTT: 32 seconds (ref 24–36)

## 2020-09-08 MED ORDER — LACTATED RINGERS IV BOLUS
500.0000 mL | Freq: Once | INTRAVENOUS | Status: AC
  Administered 2020-09-08: 500 mL via INTRAVENOUS

## 2020-09-08 MED ORDER — ACETAMINOPHEN 325 MG PO TABS
650.0000 mg | ORAL_TABLET | Freq: Once | ORAL | Status: AC
  Administered 2020-09-08: 650 mg via ORAL
  Filled 2020-09-08: qty 2

## 2020-09-08 NOTE — ED Notes (Addendum)
First contact with patient. Patient arrived via triage with daughter with complaints of left sided chest/breast pain x several days. Pt states since it has been ongoing x 1 week - she wanted to have it rechecked. Patient complains of shortness of breath at rest and during activity. Pt denies n/v/d.  Pt is A&OX 4. Respirations even/unlabored. Patient changed into gown and placed on cardiac monitor. Patient updated on plan of care. Will continue to monitor patient. EKG was completed in triage. MD at bedside upon assessment.  1312: Pt up to restroom without incident. Patients daughter at bedside. Pt given non slip socks and walks with cane without assist. Warm blanket given. Daughter needs to leave but requested to be called Santiago Glad [phone]) if not available call [place] [phone].

## 2020-09-08 NOTE — ED Triage Notes (Addendum)
Pt c/o CP x ~2 weeks-states she was seen here for same ~1week ago-NAD-slow steady gait with own cane

## 2020-09-08 NOTE — ED Notes (Signed)
Patient transported to CT 

## 2020-09-08 NOTE — ED Notes (Signed)
Patient discharged by Dr. Doren Custard. No acute distress noted upon this RN's departure of patient. Vital signs stable. Patient taken to checkout window. Discharge paperwork discussed with patient and patients family. No further questions voiced upon discharge.

## 2020-09-08 NOTE — ED Provider Notes (Signed)
[facility] EMERGENCY DEPARTMENT Provider Note   CSN: [id number] Arrival date & time: 09/08/20  1222     History Chief Complaint  Patient presents with   Chest Pain    Kimberly Travis is a 85 y.o. female.   Chest Pain Associated symptoms: no abdominal pain, no back pain, no cough, no diaphoresis, no dizziness, no fatigue, no fever, no headache, no nausea, no numbness, no palpitations, no shortness of breath, no vomiting and no weakness   Patient presents for chest pain.  This pain has been present for several weeks.  She was seen in the ED 2 weeks ago for similar pain.  She describes it is along the anterior lower aspect of her chest wall on both sides.  When she was seen previously, she was worked up for chest pain on the right side, including evaluation of her gallbladder.  Today, she reports that pain is slightly worse on the left side.  Pain is worsened with movement and palpation.  She denies any current shortness of breath or pleurisy.  She ambulates with a cane and has been ambulating normally.  She states that she has had decreased appetite but continues to eat and drink.  She does not believe that she has had recent weight loss.  She is followed by primary care doctor, as well as a nephrologist.  Per chart review, she has recently been undergoing work-up for monoclonal gammopathy of renal significance.  She denies any cancer history.  She has undergone a right-sided thoracotomy for removal of a mass that was proved to be noncancerous.  She has chronic pain along that area of scarring.  Patient reports that, since her ED visit 2 weeks ago, her pain has not changed but has been persistent.    Past Medical History:  Diagnosis Date   Gout    High cholesterol    Hypertension     There are no problems to display for this patient.   Past Surgical History:  Procedure Laterality Date   TOTAL HIP ARTHROPLASTY       OB History   No obstetric history on file.     No  family history on file.  Social History   Tobacco Use   Smoking status: Never   Smokeless tobacco: Never  Substance Use Topics   Alcohol use: Yes    Comment: socially   Drug use: Never    Home Medications Prior to Admission medications   Not on File    Allergies    Percocet [oxycodone-acetaminophen]  Review of Systems   Review of Systems  Constitutional:  Positive for appetite change. Negative for activity change, chills, diaphoresis, fatigue and fever.  HENT:  Negative for ear pain and sore throat.   Eyes:  Negative for pain and visual disturbance.  Respiratory:  Negative for cough, chest tightness, shortness of breath and wheezing.   Cardiovascular:  Positive for chest pain. Negative for palpitations.  Gastrointestinal:  Negative for abdominal pain, diarrhea, nausea and vomiting.  Genitourinary:  Negative for dysuria, flank pain, hematuria and pelvic pain.  Musculoskeletal:  Negative for arthralgias, back pain, gait problem, myalgias and neck pain.  Skin:  Negative for color change and rash.  Neurological:  Negative for dizziness, seizures, syncope, weakness, light-headedness, numbness and headaches.  All other systems reviewed and are negative.  Physical Exam Updated Vital Signs BP 138/69 (BP Location: Right Arm)   Pulse 74   Temp 98.2 F (36.8 C) (Oral)   Resp 15  Ht 5\' 5"  (1.651 m)   Wt 78.9 kg   SpO2 100%   BMI 28.96 kg/m   Physical Exam Vitals and nursing note reviewed.  Constitutional:      General: She is not in acute distress.    Appearance: She is well-developed. She is not ill-appearing, toxic-appearing or diaphoretic.  HENT:     Head: Normocephalic and atraumatic.  Eyes:     Conjunctiva/sclera: Conjunctivae normal.  Neck:     Vascular: No JVD.  Cardiovascular:     Rate and Rhythm: Normal rate and regular rhythm.     Heart sounds: No murmur heard. Pulmonary:     Effort: Pulmonary effort is normal. No respiratory distress.     Breath sounds:  Normal breath sounds.  Chest:     Chest wall: Tenderness present. No edema.  Abdominal:     Palpations: Abdomen is soft.     Tenderness: There is no abdominal tenderness.  Musculoskeletal:     Cervical back: Normal range of motion and neck supple.     Right lower leg: No edema.     Left lower leg: No edema.  Skin:    General: Skin is warm and dry.  Neurological:     General: No focal deficit present.     Mental Status: She is alert and oriented to person, place, and time.     Cranial Nerves: No cranial nerve deficit.     Motor: No weakness.  Psychiatric:        Mood and Affect: Mood normal.        Behavior: Behavior normal.    ED Results / Procedures / Treatments   Labs (all labs ordered are listed, but only abnormal results are displayed) Labs Reviewed  COMPREHENSIVE METABOLIC PANEL - Abnormal; Notable for the following components:      Result Value   Creatinine, Ser 1.59 (*)    Total Protein 8.2 (*)    GFR, Estimated 32 (*)    All other components within normal limits  URINALYSIS, ROUTINE W REFLEX MICROSCOPIC - Abnormal; Notable for the following components:   Protein, ur 100 (*)    All other components within normal limits  CBC WITH DIFFERENTIAL/PLATELET - Abnormal; Notable for the following components:   RBC 6.59 (*)    Hemoglobin 16.6 (*)    HCT 50.3 (*)    MCV 76.3 (*)    MCH 25.2 (*)    RDW 17.8 (*)    Platelets 496 (*)    Monocytes Absolute 1.6 (*)    Basophils Absolute 0.2 (*)    Abs Immature Granulocytes 0.19 (*)    All other components within normal limits  URINALYSIS, MICROSCOPIC (REFLEX) - Abnormal; Notable for the following components:   Bacteria, UA FEW (*)    All other components within normal limits  MAGNESIUM  PROTIME-INR  APTT  CBC WITH DIFFERENTIAL/PLATELET  TROPONIN I (HIGH SENSITIVITY)  TROPONIN I (HIGH SENSITIVITY)    EKG None  Radiology CT Chest Wo Contrast  Result Date: 09/08/2020 CLINICAL DATA:  Two weeks of left-sided chest  pain, shortness of breath EXAM: CT CHEST WITHOUT CONTRAST TECHNIQUE: Multidetector CT imaging of the chest was performed following the standard protocol without IV contrast. COMPARISON:  Same day chest radiograph, CT chest 12/06/2009 FINDINGS: Cardiovascular: The heart is not enlarged. There is no pericardial effusion. Extensive coronary artery calcifications and mild aortic valve calcifications are seen. There is calcified atherosclerotic plaque of the aortic arch. Mediastinum/Nodes: The thyroid is unremarkable. There is  no mediastinal or axillary lymphadenopathy. There is no bulky hilar lymphadenopathy. Lungs/Pleura: The trachea and central airways are patent. Asymmetric elevation of the left hemidiaphragm is not significantly changed. Postsurgical changes are again seen in the right hilar/infrahilar region probably reflecting prior lung resection. Linear opacities in the lateral right base are consistent with scar. Otherwise, there is no focal consolidation or pulmonary edema. There is no pleural effusion or pneumothorax. Upper Abdomen: There is a small hiatal hernia. Musculoskeletal: There is no acute osseous abnormality. No rib fracture is identified. There is multilevel degenerative change of the thoracic spine. IMPRESSION: 1. No acute findings in the chest.  No pleural effusion. 2. Postsurgical changes in the right hilar/infrahilar region likely reflecting prior lung resection. 3. Extensive coronary artery calcifications and Aortic Atherosclerosis (ICD10-I70.0). Electronically Signed   By: Valetta Mole M.D.   On: 09/08/2020 14:59   DG Chest Portable 1 View  Result Date: 09/08/2020 CLINICAL DATA:  Chest wall pain. EXAM: PORTABLE CHEST 1 VIEW COMPARISON:  August 26, 2020. FINDINGS: The heart size and mediastinal contours are within normal limits. No pneumothorax or pleural effusion is noted. Minimal right basilar scarring is noted. No acute abnormality is noted. Postoperative changes noted in the right hilar  region. The visualized skeletal structures are unremarkable. IMPRESSION: No acute abnormality seen. Electronically Signed   By: Marijo Conception M.D.   On: 09/08/2020 13:42    Procedures Procedures   Medications Ordered in ED Medications  lactated ringers bolus 500 mL (0 mLs Intravenous Stopped 09/08/20 1357)  acetaminophen (TYLENOL) tablet 650 mg (650 mg Oral Given 09/08/20 1520)    ED Course  I have reviewed the triage vital signs and the nursing notes.  Pertinent labs & imaging results that were available during my care of the patient were reviewed by me and considered in my medical decision making (see chart for details).    MDM Rules/Calculators/A&P                           Patient is a 85 year old female who presents for 2 weeks of ongoing chest wall pain.  Vital signs on arrival notable for moderate hypertension.  On exam, patient is well-appearing.  She endorses pain on the anterior lower aspect of her chest wall, greater on the left.  This area is tenderness to palpation.  EKG shows no evidence of ischemia or heart strain.  When asked what patient is worried about the most, she states that she is worried about her heart.  Given her age, she is at increased risk for ACS.  Chest pain work-up was initiated.  Given her decreased appetite, IV fluids were given.  Tylenol was given for analgesia.  Lab showed normal electrolytes, baseline elevated creatinine, high-normal calcium.  CBC shows no leukocytosis.  She does have mild erythrocytosis, thrombocytosis, monocytosis, basophilia, and some abnormal cells on blood smear.  I suspect this is chronic and and reassured the patient is being worked up for hematologic disorder by her primary care doctor.  Patient's troponins were normal.  CT scan of chest was ordered to assess for bony etiology.  Given the patient's CKD, use of IV contrast was avoided.  CT scan showed no acute findings.  Patient was advised to continue to follow-up with her outpatient  providers for monitoring of uptrending calcium and further work-up for hematologic abnormalities.  Patient was discharged in good condition.  Final Clinical Impression(s) / ED Diagnoses Final diagnoses:  Chest wall  pain    Rx / DC Orders ED Discharge Orders     None        Godfrey Pick, MD 09/09/20 1719

## 2020-09-10 DIAGNOSIS — E7800 Pure hypercholesterolemia, unspecified: Secondary | ICD-10-CM | POA: Diagnosis not present

## 2020-09-10 DIAGNOSIS — R072 Precordial pain: Secondary | ICD-10-CM | POA: Diagnosis not present

## 2020-09-10 DIAGNOSIS — R0789 Other chest pain: Secondary | ICD-10-CM | POA: Diagnosis not present

## 2020-09-10 DIAGNOSIS — I10 Essential (primary) hypertension: Secondary | ICD-10-CM | POA: Diagnosis not present

## 2020-09-10 DIAGNOSIS — R002 Palpitations: Secondary | ICD-10-CM | POA: Diagnosis not present

## 2020-09-11 DIAGNOSIS — I517 Cardiomegaly: Secondary | ICD-10-CM | POA: Diagnosis not present

## 2020-09-24 DIAGNOSIS — K8050 Calculus of bile duct without cholangitis or cholecystitis without obstruction: Secondary | ICD-10-CM | POA: Diagnosis not present

## 2020-09-28 ENCOUNTER — Other Ambulatory Visit (HOSPITAL_BASED_OUTPATIENT_CLINIC_OR_DEPARTMENT_OTHER): Payer: Self-pay | Admitting: Internal Medicine

## 2020-09-28 DIAGNOSIS — Z1231 Encounter for screening mammogram for malignant neoplasm of breast: Secondary | ICD-10-CM

## 2020-10-07 DIAGNOSIS — N1832 Chronic kidney disease, stage 3b: Secondary | ICD-10-CM | POA: Diagnosis not present

## 2020-10-07 DIAGNOSIS — N644 Mastodynia: Secondary | ICD-10-CM | POA: Diagnosis not present

## 2020-10-07 DIAGNOSIS — E559 Vitamin D deficiency, unspecified: Secondary | ICD-10-CM | POA: Diagnosis not present

## 2020-10-07 DIAGNOSIS — R079 Chest pain, unspecified: Secondary | ICD-10-CM | POA: Diagnosis not present

## 2020-10-07 DIAGNOSIS — E7211 Homocystinuria: Secondary | ICD-10-CM | POA: Diagnosis not present

## 2020-10-07 DIAGNOSIS — K8020 Calculus of gallbladder without cholecystitis without obstruction: Secondary | ICD-10-CM | POA: Diagnosis not present

## 2020-10-07 DIAGNOSIS — E782 Mixed hyperlipidemia: Secondary | ICD-10-CM | POA: Diagnosis not present

## 2020-10-07 DIAGNOSIS — I119 Hypertensive heart disease without heart failure: Secondary | ICD-10-CM | POA: Diagnosis not present

## 2020-10-19 DIAGNOSIS — I129 Hypertensive chronic kidney disease with stage 1 through stage 4 chronic kidney disease, or unspecified chronic kidney disease: Secondary | ICD-10-CM | POA: Diagnosis not present

## 2020-10-19 DIAGNOSIS — D8989 Other specified disorders involving the immune mechanism, not elsewhere classified: Secondary | ICD-10-CM | POA: Diagnosis not present

## 2020-10-19 DIAGNOSIS — D472 Monoclonal gammopathy: Secondary | ICD-10-CM | POA: Diagnosis not present

## 2020-10-19 DIAGNOSIS — M1990 Unspecified osteoarthritis, unspecified site: Secondary | ICD-10-CM | POA: Diagnosis not present

## 2020-10-19 DIAGNOSIS — I10 Essential (primary) hypertension: Secondary | ICD-10-CM | POA: Diagnosis not present

## 2020-10-19 DIAGNOSIS — N189 Chronic kidney disease, unspecified: Secondary | ICD-10-CM | POA: Diagnosis not present

## 2020-11-09 DIAGNOSIS — R922 Inconclusive mammogram: Secondary | ICD-10-CM | POA: Diagnosis not present

## 2020-11-09 DIAGNOSIS — N644 Mastodynia: Secondary | ICD-10-CM | POA: Diagnosis not present

## 2020-12-01 DIAGNOSIS — I10 Essential (primary) hypertension: Secondary | ICD-10-CM | POA: Diagnosis not present

## 2020-12-01 DIAGNOSIS — E559 Vitamin D deficiency, unspecified: Secondary | ICD-10-CM | POA: Diagnosis not present

## 2020-12-01 DIAGNOSIS — N390 Urinary tract infection, site not specified: Secondary | ICD-10-CM | POA: Diagnosis not present

## 2020-12-01 DIAGNOSIS — N183 Chronic kidney disease, stage 3 unspecified: Secondary | ICD-10-CM | POA: Diagnosis not present

## 2020-12-01 DIAGNOSIS — R809 Proteinuria, unspecified: Secondary | ICD-10-CM | POA: Diagnosis not present

## 2020-12-14 DIAGNOSIS — N1832 Chronic kidney disease, stage 3b: Secondary | ICD-10-CM | POA: Diagnosis not present

## 2020-12-14 DIAGNOSIS — Z23 Encounter for immunization: Secondary | ICD-10-CM | POA: Diagnosis not present

## 2020-12-14 DIAGNOSIS — E559 Vitamin D deficiency, unspecified: Secondary | ICD-10-CM | POA: Diagnosis not present

## 2020-12-14 DIAGNOSIS — E7211 Homocystinuria: Secondary | ICD-10-CM | POA: Diagnosis not present

## 2020-12-14 DIAGNOSIS — R079 Chest pain, unspecified: Secondary | ICD-10-CM | POA: Diagnosis not present

## 2020-12-14 DIAGNOSIS — I119 Hypertensive heart disease without heart failure: Secondary | ICD-10-CM | POA: Diagnosis not present

## 2020-12-14 DIAGNOSIS — E782 Mixed hyperlipidemia: Secondary | ICD-10-CM | POA: Diagnosis not present

## 2021-02-01 DIAGNOSIS — D471 Chronic myeloproliferative disease: Secondary | ICD-10-CM | POA: Diagnosis not present

## 2021-02-01 DIAGNOSIS — I10 Essential (primary) hypertension: Secondary | ICD-10-CM | POA: Diagnosis not present

## 2021-02-01 DIAGNOSIS — R7989 Other specified abnormal findings of blood chemistry: Secondary | ICD-10-CM | POA: Diagnosis not present

## 2021-02-01 DIAGNOSIS — D472 Monoclonal gammopathy: Secondary | ICD-10-CM | POA: Diagnosis not present

## 2021-02-01 DIAGNOSIS — D75839 Thrombocytosis, unspecified: Secondary | ICD-10-CM | POA: Diagnosis not present

## 2021-02-09 DIAGNOSIS — D472 Monoclonal gammopathy: Secondary | ICD-10-CM | POA: Diagnosis not present

## 2021-02-25 DIAGNOSIS — D471 Chronic myeloproliferative disease: Secondary | ICD-10-CM | POA: Diagnosis not present

## 2021-02-25 DIAGNOSIS — D469 Myelodysplastic syndrome, unspecified: Secondary | ICD-10-CM | POA: Diagnosis not present

## 2021-02-25 DIAGNOSIS — D45 Polycythemia vera: Secondary | ICD-10-CM | POA: Diagnosis not present

## 2021-02-25 DIAGNOSIS — D751 Secondary polycythemia: Secondary | ICD-10-CM | POA: Diagnosis not present

## 2021-03-09 DIAGNOSIS — Z79631 Long term (current) use of antimetabolite agent: Secondary | ICD-10-CM | POA: Diagnosis not present

## 2021-03-09 DIAGNOSIS — D472 Monoclonal gammopathy: Secondary | ICD-10-CM | POA: Diagnosis not present

## 2021-03-09 DIAGNOSIS — Z7982 Long term (current) use of aspirin: Secondary | ICD-10-CM | POA: Diagnosis not present

## 2021-03-09 DIAGNOSIS — I10 Essential (primary) hypertension: Secondary | ICD-10-CM | POA: Diagnosis not present

## 2021-03-09 DIAGNOSIS — D471 Chronic myeloproliferative disease: Secondary | ICD-10-CM | POA: Diagnosis not present

## 2021-04-01 DIAGNOSIS — N183 Chronic kidney disease, stage 3 unspecified: Secondary | ICD-10-CM | POA: Diagnosis not present

## 2021-04-01 DIAGNOSIS — E875 Hyperkalemia: Secondary | ICD-10-CM | POA: Diagnosis not present

## 2021-04-01 DIAGNOSIS — I10 Essential (primary) hypertension: Secondary | ICD-10-CM | POA: Diagnosis not present

## 2021-04-01 DIAGNOSIS — N390 Urinary tract infection, site not specified: Secondary | ICD-10-CM | POA: Diagnosis not present

## 2021-04-01 DIAGNOSIS — I119 Hypertensive heart disease without heart failure: Secondary | ICD-10-CM | POA: Diagnosis not present

## 2021-05-05 DIAGNOSIS — M479 Spondylosis, unspecified: Secondary | ICD-10-CM | POA: Diagnosis not present

## 2021-05-05 DIAGNOSIS — Z136 Encounter for screening for cardiovascular disorders: Secondary | ICD-10-CM | POA: Diagnosis not present

## 2021-05-05 DIAGNOSIS — Z1329 Encounter for screening for other suspected endocrine disorder: Secondary | ICD-10-CM | POA: Diagnosis not present

## 2021-05-05 DIAGNOSIS — R7303 Prediabetes: Secondary | ICD-10-CM | POA: Diagnosis not present

## 2021-05-05 DIAGNOSIS — M546 Pain in thoracic spine: Secondary | ICD-10-CM | POA: Diagnosis not present

## 2021-05-05 DIAGNOSIS — Z0001 Encounter for general adult medical examination with abnormal findings: Secondary | ICD-10-CM | POA: Diagnosis not present

## 2021-05-05 DIAGNOSIS — I10 Essential (primary) hypertension: Secondary | ICD-10-CM | POA: Diagnosis not present

## 2021-05-05 DIAGNOSIS — Z131 Encounter for screening for diabetes mellitus: Secondary | ICD-10-CM | POA: Diagnosis not present

## 2021-05-05 DIAGNOSIS — E782 Mixed hyperlipidemia: Secondary | ICD-10-CM | POA: Diagnosis not present

## 2021-05-13 DIAGNOSIS — I10 Essential (primary) hypertension: Secondary | ICD-10-CM | POA: Diagnosis not present

## 2021-05-13 DIAGNOSIS — N390 Urinary tract infection, site not specified: Secondary | ICD-10-CM | POA: Diagnosis not present

## 2021-05-13 DIAGNOSIS — N183 Chronic kidney disease, stage 3 unspecified: Secondary | ICD-10-CM | POA: Diagnosis not present

## 2021-05-13 DIAGNOSIS — E559 Vitamin D deficiency, unspecified: Secondary | ICD-10-CM | POA: Diagnosis not present

## 2021-06-06 DIAGNOSIS — J22 Unspecified acute lower respiratory infection: Secondary | ICD-10-CM | POA: Diagnosis not present

## 2021-07-25 DIAGNOSIS — E559 Vitamin D deficiency, unspecified: Secondary | ICD-10-CM | POA: Diagnosis not present

## 2021-07-25 DIAGNOSIS — I119 Hypertensive heart disease without heart failure: Secondary | ICD-10-CM | POA: Diagnosis not present

## 2021-07-25 DIAGNOSIS — E7211 Homocystinuria: Secondary | ICD-10-CM | POA: Diagnosis not present

## 2021-07-25 DIAGNOSIS — N1832 Chronic kidney disease, stage 3b: Secondary | ICD-10-CM | POA: Diagnosis not present

## 2021-07-25 DIAGNOSIS — Z0001 Encounter for general adult medical examination with abnormal findings: Secondary | ICD-10-CM | POA: Diagnosis not present

## 2021-07-25 DIAGNOSIS — E782 Mixed hyperlipidemia: Secondary | ICD-10-CM | POA: Diagnosis not present

## 2021-07-25 DIAGNOSIS — K5900 Constipation, unspecified: Secondary | ICD-10-CM | POA: Diagnosis not present

## 2021-09-01 DIAGNOSIS — I119 Hypertensive heart disease without heart failure: Secondary | ICD-10-CM | POA: Diagnosis not present

## 2021-09-01 DIAGNOSIS — N1832 Chronic kidney disease, stage 3b: Secondary | ICD-10-CM | POA: Diagnosis not present

## 2021-09-01 DIAGNOSIS — E782 Mixed hyperlipidemia: Secondary | ICD-10-CM | POA: Diagnosis not present

## 2021-10-01 DIAGNOSIS — I119 Hypertensive heart disease without heart failure: Secondary | ICD-10-CM | POA: Diagnosis not present

## 2021-10-01 DIAGNOSIS — N1832 Chronic kidney disease, stage 3b: Secondary | ICD-10-CM | POA: Diagnosis not present

## 2021-10-01 DIAGNOSIS — E782 Mixed hyperlipidemia: Secondary | ICD-10-CM | POA: Diagnosis not present

## 2021-10-02 IMAGING — MG MM DIGITAL SCREENING BILAT W/ TOMO AND CAD
8 series · 8 of 24 positions shown · non-contrast
Comparison: Previous exam(s).

CLINICAL DATA: Screening.

EXAM:
DIGITAL SCREENING BILATERAL MAMMOGRAM WITH TOMOSYNTHESIS AND CAD
TECHNIQUE: Bilateral screening digital craniocaudal and mediolateral oblique
mammograms were obtained. Bilateral screening digital breast
tomosynthesis was performed. The images were evaluated with
computer-aided detection.

[L MLO synth-2D]
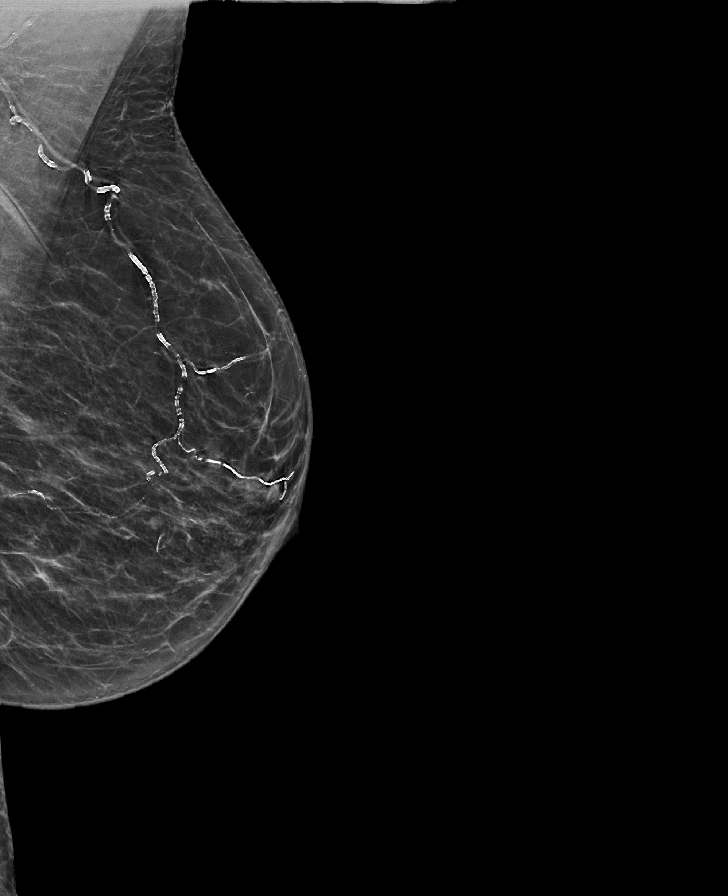

[L CC synth-2D]
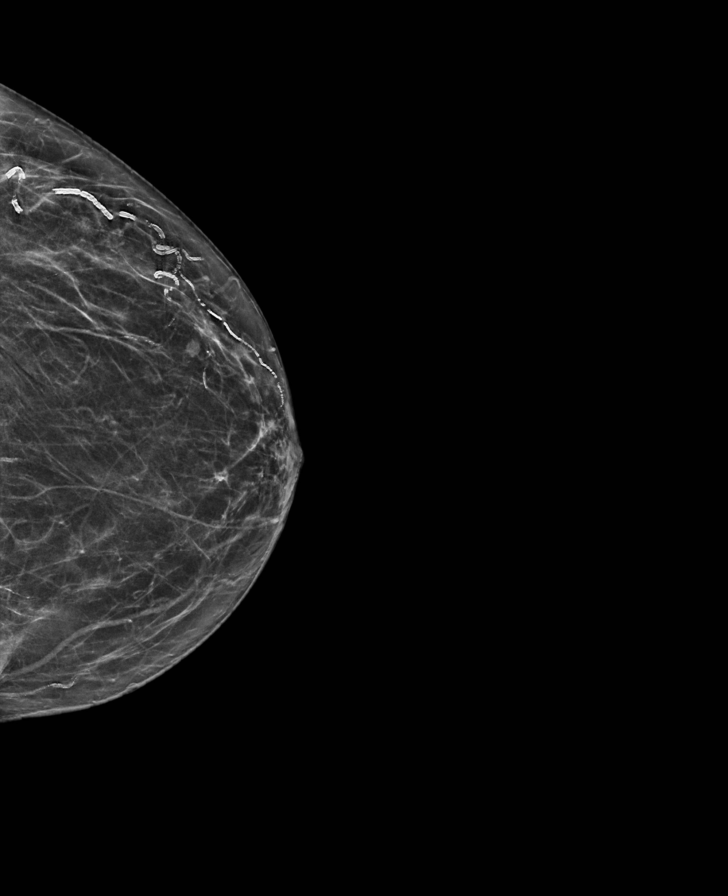

[R MLO synth-2D]
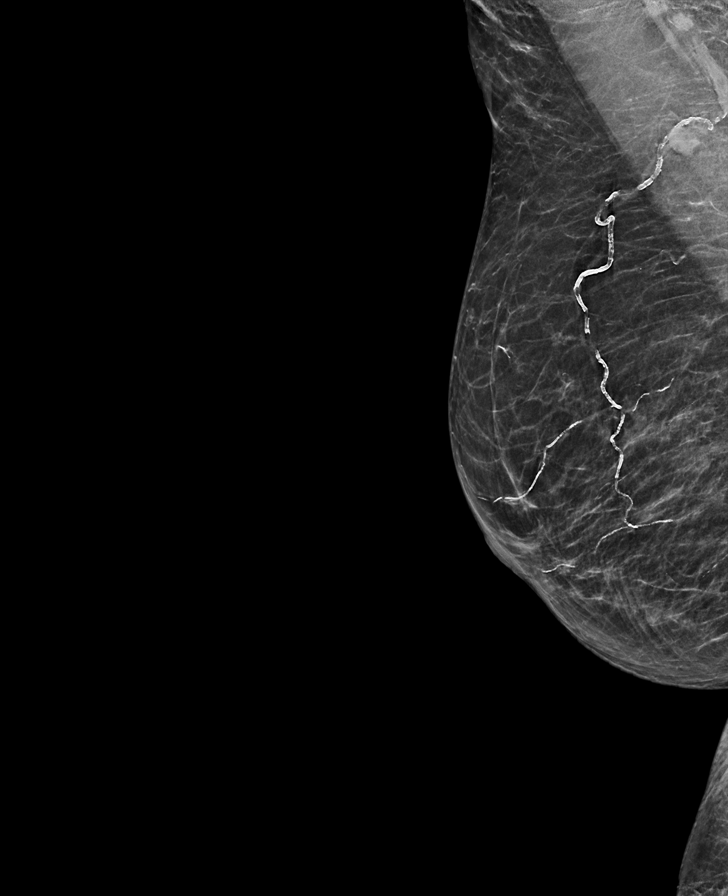

[R CC synth-2D]
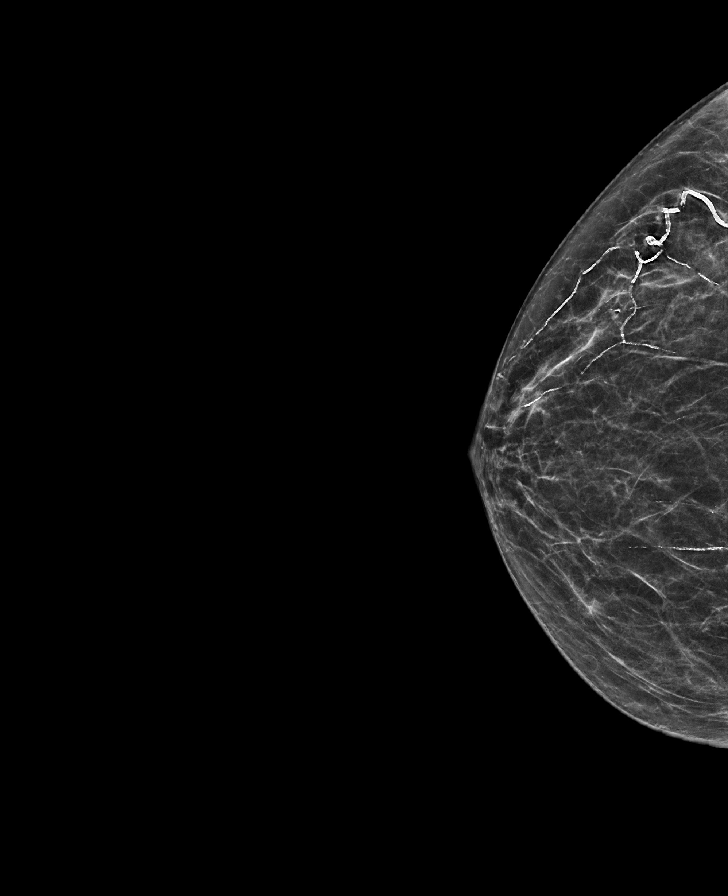

[R CC tomo · tomo slice 31/60.0]
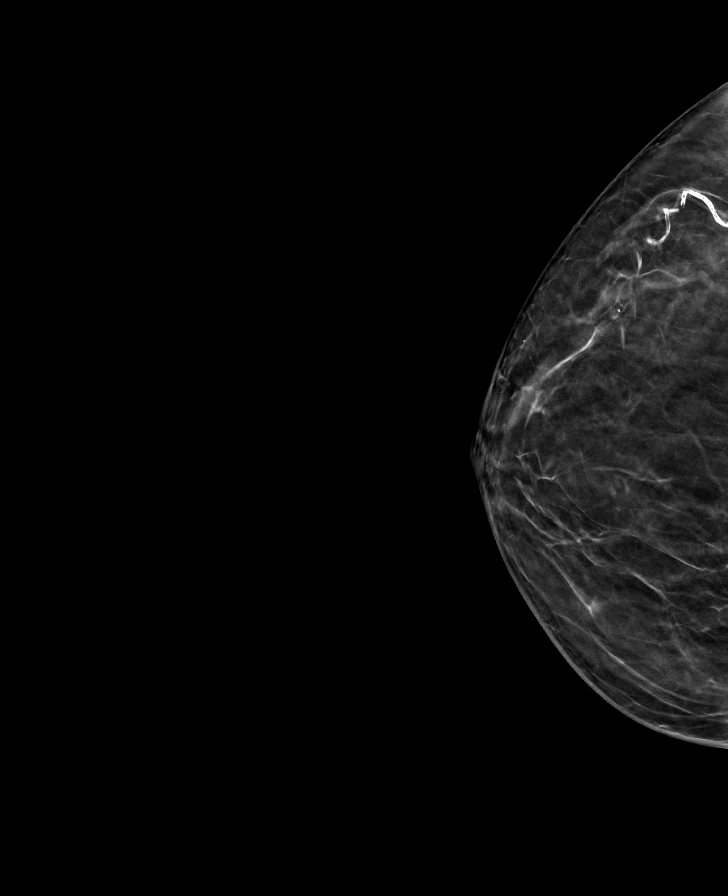

[L MLO tomo · tomo slice 31/60.0]
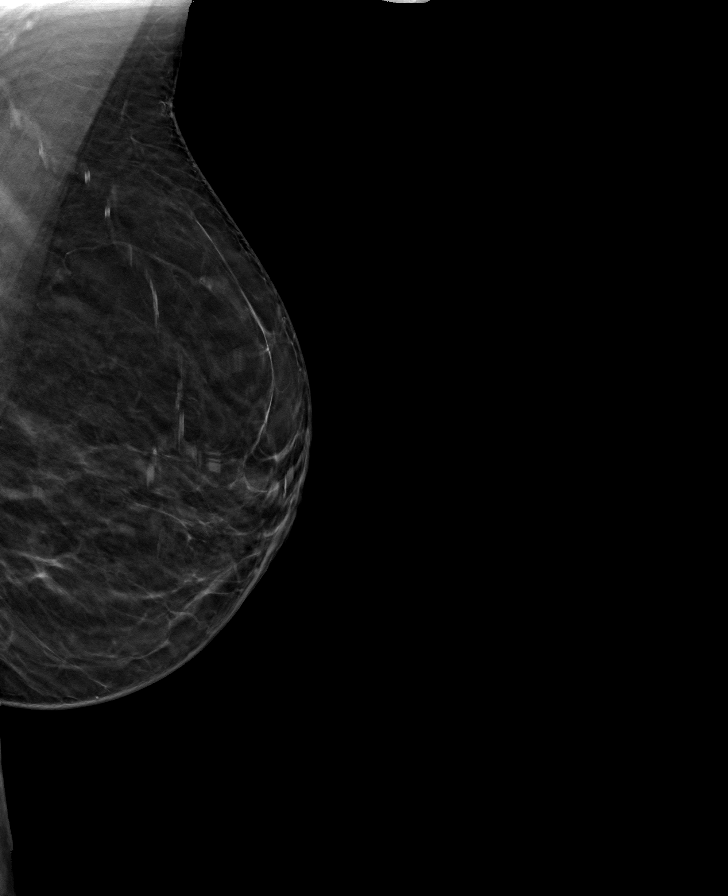

[R MLO tomo · tomo slice 29/57.0]
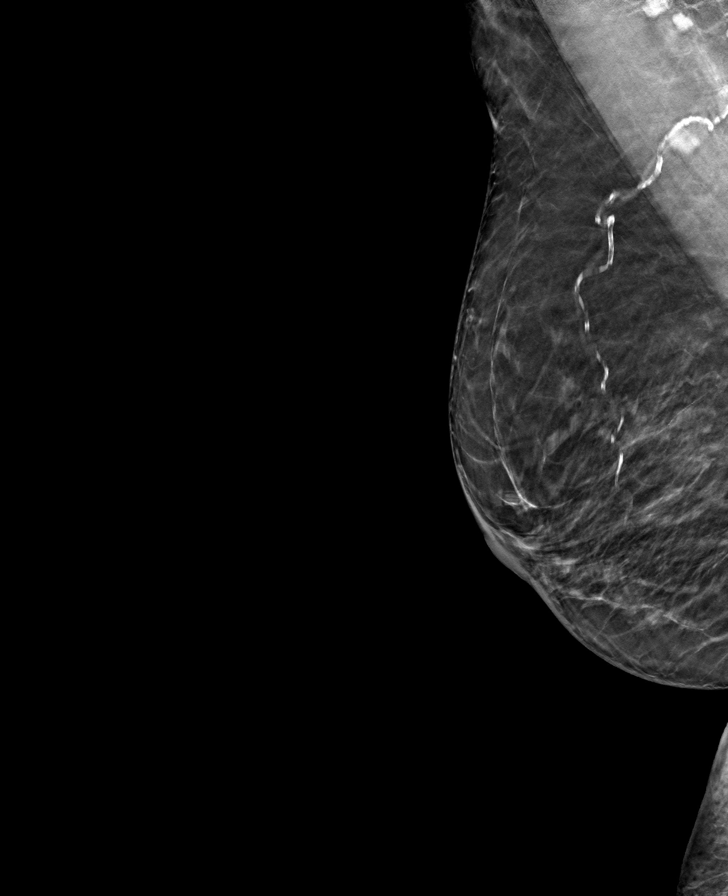

[L CC tomo · tomo slice 29/58.0]
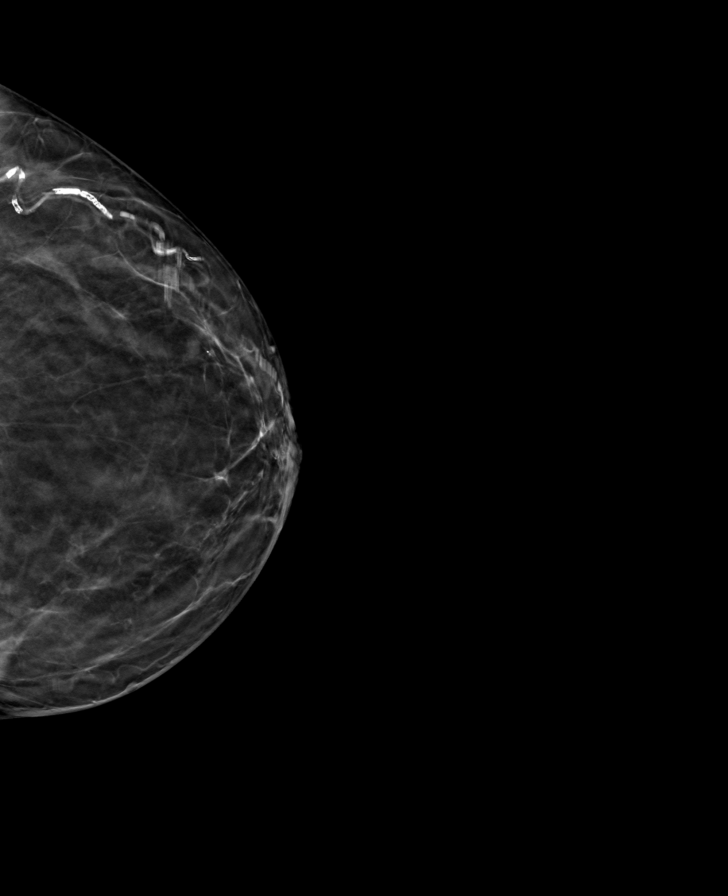

[8 of 24 positions shown; findings below may reference images not displayed]

ACR Breast Density Category b: There are scattered areas of
fibroglandular density.
FINDINGS: There are no findings suspicious for malignancy. The images were
evaluated with computer-aided detection.
IMPRESSION: No mammographic evidence of malignancy. A result letter of this
screening mammogram will be mailed directly to the patient.

RECOMMENDATION:
Screening mammogram in one year. (Code:[id number]-[id number])

BI-RADS CATEGORY  1: Negative.

## 2021-10-17 DIAGNOSIS — E7211 Homocystinuria: Secondary | ICD-10-CM | POA: Diagnosis not present

## 2021-10-17 DIAGNOSIS — K5900 Constipation, unspecified: Secondary | ICD-10-CM | POA: Diagnosis not present

## 2021-10-17 DIAGNOSIS — Z0001 Encounter for general adult medical examination with abnormal findings: Secondary | ICD-10-CM | POA: Diagnosis not present

## 2021-10-17 DIAGNOSIS — I119 Hypertensive heart disease without heart failure: Secondary | ICD-10-CM | POA: Diagnosis not present

## 2021-10-17 DIAGNOSIS — M10071 Idiopathic gout, right ankle and foot: Secondary | ICD-10-CM | POA: Diagnosis not present

## 2021-10-17 DIAGNOSIS — E559 Vitamin D deficiency, unspecified: Secondary | ICD-10-CM | POA: Diagnosis not present

## 2021-10-17 DIAGNOSIS — N1832 Chronic kidney disease, stage 3b: Secondary | ICD-10-CM | POA: Diagnosis not present

## 2021-10-17 DIAGNOSIS — E782 Mixed hyperlipidemia: Secondary | ICD-10-CM | POA: Diagnosis not present

## 2021-10-26 DIAGNOSIS — M10071 Idiopathic gout, right ankle and foot: Secondary | ICD-10-CM | POA: Diagnosis not present

## 2021-10-26 DIAGNOSIS — E782 Mixed hyperlipidemia: Secondary | ICD-10-CM | POA: Diagnosis not present

## 2021-10-26 DIAGNOSIS — D72829 Elevated white blood cell count, unspecified: Secondary | ICD-10-CM | POA: Diagnosis not present

## 2021-10-26 DIAGNOSIS — I119 Hypertensive heart disease without heart failure: Secondary | ICD-10-CM | POA: Diagnosis not present

## 2021-10-26 DIAGNOSIS — E559 Vitamin D deficiency, unspecified: Secondary | ICD-10-CM | POA: Diagnosis not present

## 2021-10-26 DIAGNOSIS — N1832 Chronic kidney disease, stage 3b: Secondary | ICD-10-CM | POA: Diagnosis not present

## 2021-10-26 DIAGNOSIS — E7211 Homocystinuria: Secondary | ICD-10-CM | POA: Diagnosis not present

## 2021-10-27 DIAGNOSIS — D72829 Elevated white blood cell count, unspecified: Secondary | ICD-10-CM | POA: Diagnosis not present

## 2021-11-01 DIAGNOSIS — E782 Mixed hyperlipidemia: Secondary | ICD-10-CM | POA: Diagnosis not present

## 2021-11-01 DIAGNOSIS — N1832 Chronic kidney disease, stage 3b: Secondary | ICD-10-CM | POA: Diagnosis not present

## 2021-11-01 DIAGNOSIS — I119 Hypertensive heart disease without heart failure: Secondary | ICD-10-CM | POA: Diagnosis not present

## 2021-11-11 DIAGNOSIS — N390 Urinary tract infection, site not specified: Secondary | ICD-10-CM | POA: Diagnosis not present

## 2021-11-11 DIAGNOSIS — D72829 Elevated white blood cell count, unspecified: Secondary | ICD-10-CM | POA: Diagnosis not present

## 2021-11-11 DIAGNOSIS — N183 Chronic kidney disease, stage 3 unspecified: Secondary | ICD-10-CM | POA: Diagnosis not present

## 2021-11-11 DIAGNOSIS — E785 Hyperlipidemia, unspecified: Secondary | ICD-10-CM | POA: Diagnosis not present

## 2021-11-11 DIAGNOSIS — I10 Essential (primary) hypertension: Secondary | ICD-10-CM | POA: Diagnosis not present

## 2021-11-14 DIAGNOSIS — N183 Chronic kidney disease, stage 3 unspecified: Secondary | ICD-10-CM | POA: Diagnosis not present

## 2021-11-29 DIAGNOSIS — H2513 Age-related nuclear cataract, bilateral: Secondary | ICD-10-CM | POA: Diagnosis not present

## 2021-12-01 DIAGNOSIS — E782 Mixed hyperlipidemia: Secondary | ICD-10-CM | POA: Diagnosis not present

## 2021-12-01 DIAGNOSIS — I119 Hypertensive heart disease without heart failure: Secondary | ICD-10-CM | POA: Diagnosis not present

## 2021-12-01 DIAGNOSIS — N1832 Chronic kidney disease, stage 3b: Secondary | ICD-10-CM | POA: Diagnosis not present

## 2021-12-05 DIAGNOSIS — N1832 Chronic kidney disease, stage 3b: Secondary | ICD-10-CM | POA: Diagnosis not present

## 2021-12-05 DIAGNOSIS — E559 Vitamin D deficiency, unspecified: Secondary | ICD-10-CM | POA: Diagnosis not present

## 2021-12-05 DIAGNOSIS — M79671 Pain in right foot: Secondary | ICD-10-CM | POA: Diagnosis not present

## 2021-12-05 DIAGNOSIS — K5900 Constipation, unspecified: Secondary | ICD-10-CM | POA: Diagnosis not present

## 2021-12-05 DIAGNOSIS — E7211 Homocystinuria: Secondary | ICD-10-CM | POA: Diagnosis not present

## 2021-12-05 DIAGNOSIS — I119 Hypertensive heart disease without heart failure: Secondary | ICD-10-CM | POA: Diagnosis not present

## 2021-12-05 DIAGNOSIS — E782 Mixed hyperlipidemia: Secondary | ICD-10-CM | POA: Diagnosis not present

## 2021-12-06 DIAGNOSIS — H25811 Combined forms of age-related cataract, right eye: Secondary | ICD-10-CM | POA: Diagnosis not present

## 2021-12-06 DIAGNOSIS — H25813 Combined forms of age-related cataract, bilateral: Secondary | ICD-10-CM | POA: Diagnosis not present

## 2021-12-06 DIAGNOSIS — H25812 Combined forms of age-related cataract, left eye: Secondary | ICD-10-CM | POA: Diagnosis not present

## 2021-12-16 DIAGNOSIS — Z5111 Encounter for antineoplastic chemotherapy: Secondary | ICD-10-CM | POA: Diagnosis not present

## 2021-12-16 DIAGNOSIS — Z7982 Long term (current) use of aspirin: Secondary | ICD-10-CM | POA: Diagnosis not present

## 2021-12-16 DIAGNOSIS — D471 Chronic myeloproliferative disease: Secondary | ICD-10-CM | POA: Diagnosis not present

## 2022-01-01 DIAGNOSIS — E782 Mixed hyperlipidemia: Secondary | ICD-10-CM | POA: Diagnosis not present

## 2022-01-01 DIAGNOSIS — N1832 Chronic kidney disease, stage 3b: Secondary | ICD-10-CM | POA: Diagnosis not present

## 2022-01-01 DIAGNOSIS — I119 Hypertensive heart disease without heart failure: Secondary | ICD-10-CM | POA: Diagnosis not present

## 2022-01-25 IMAGING — US US ABDOMEN LIMITED
1 series · 14 of 25 positions shown · non-contrast
Comparison: None.

CLINICAL DATA: Abdominal pain

EXAM:
ULTRASOUND ABDOMEN LIMITED RIGHT UPPER QUADRANT

[Series 1: us abdomen limited · 50 acquisitions, 14 frames shown]
[im 1/50]
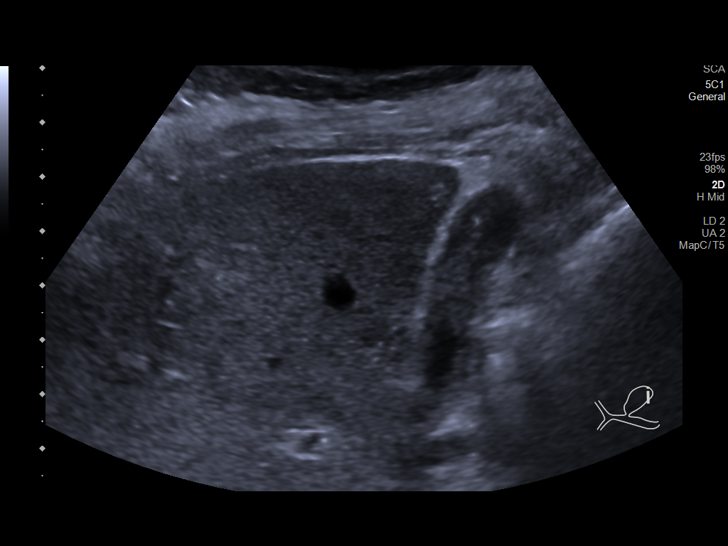
[im 5/50]
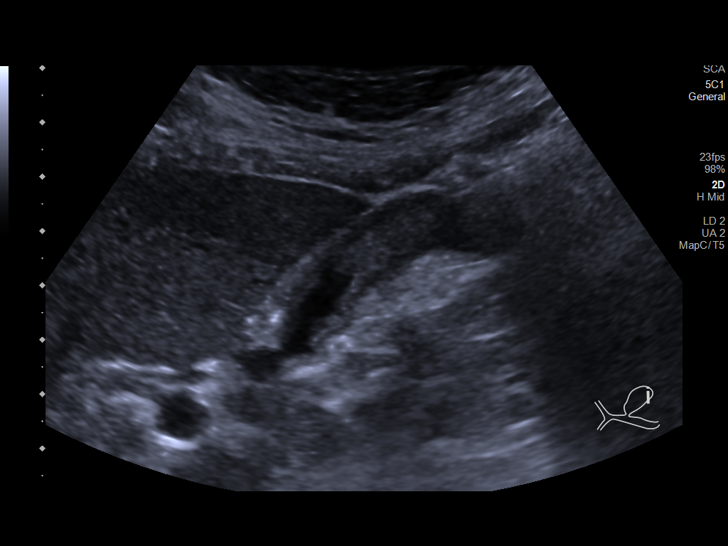
[im 9/50]
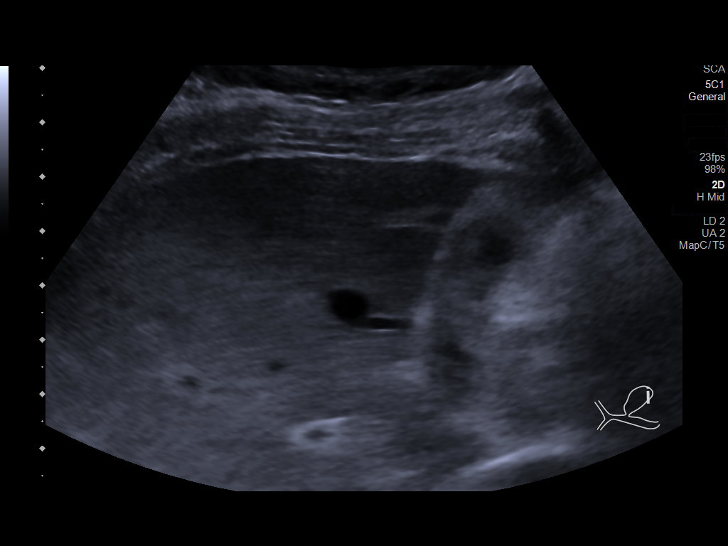
[im 13/50]
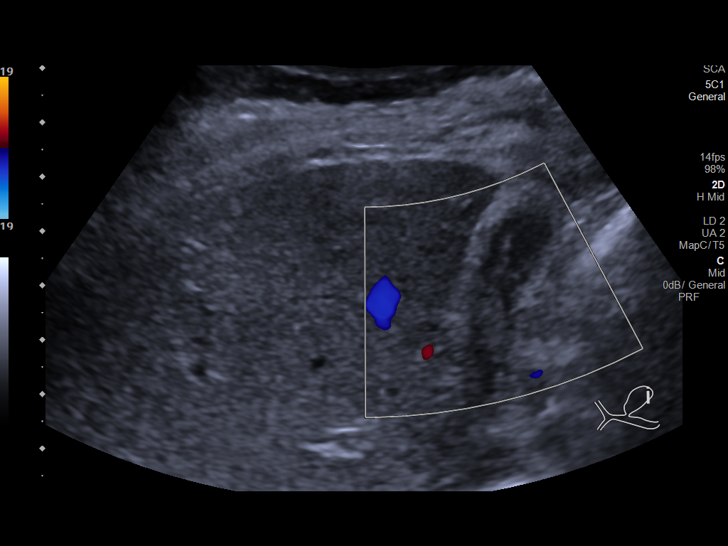
[im 17/50]
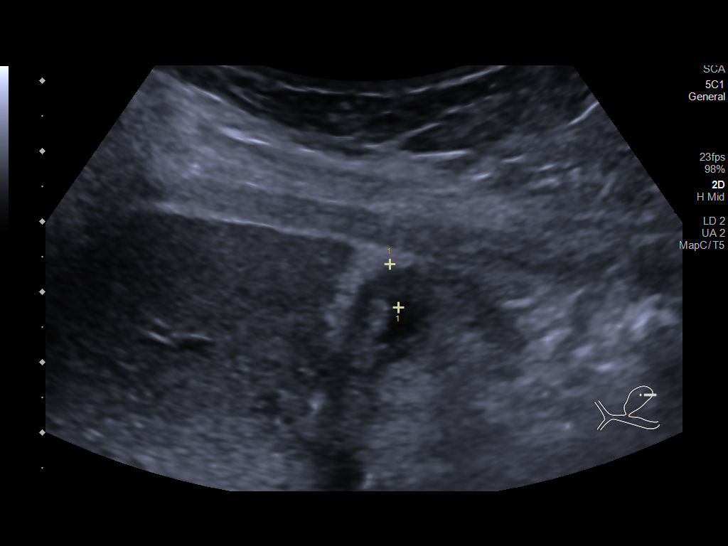
[im 19/50]
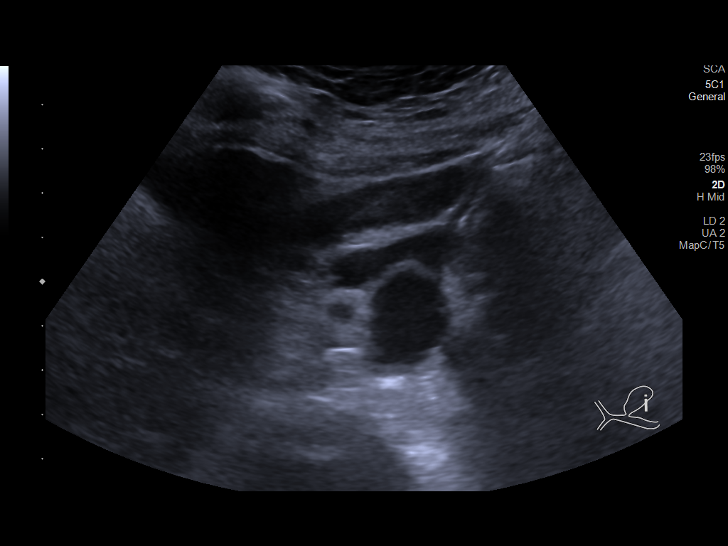
[im 23/50]
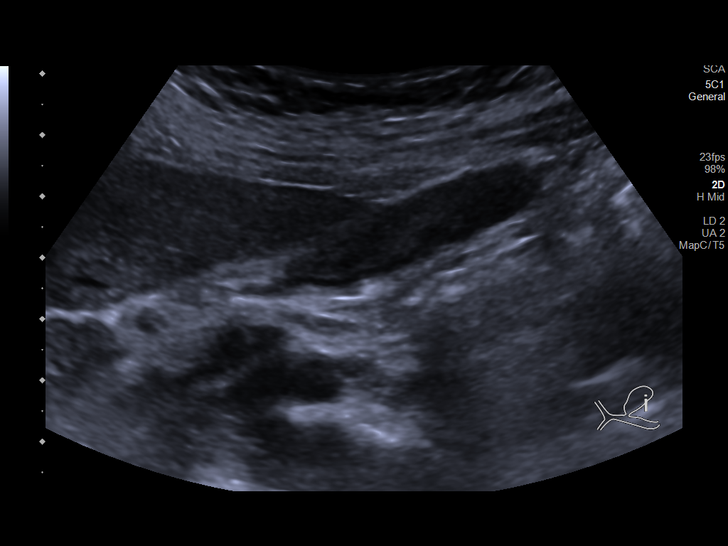
[im 27/50]
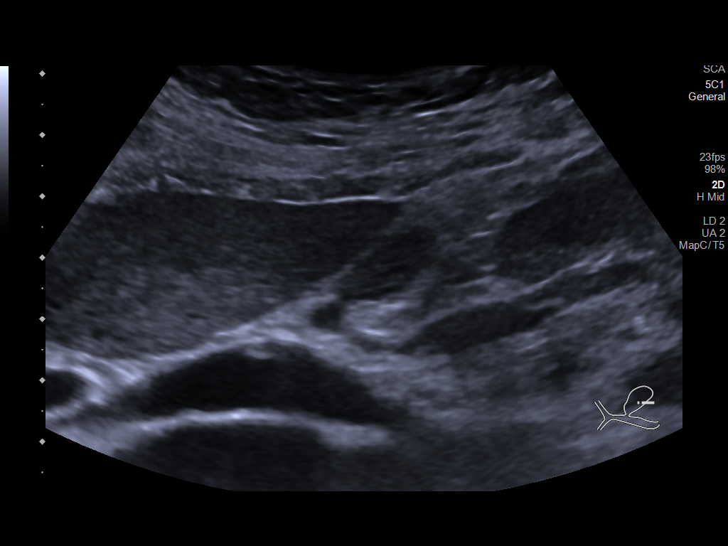
[im 31/50]
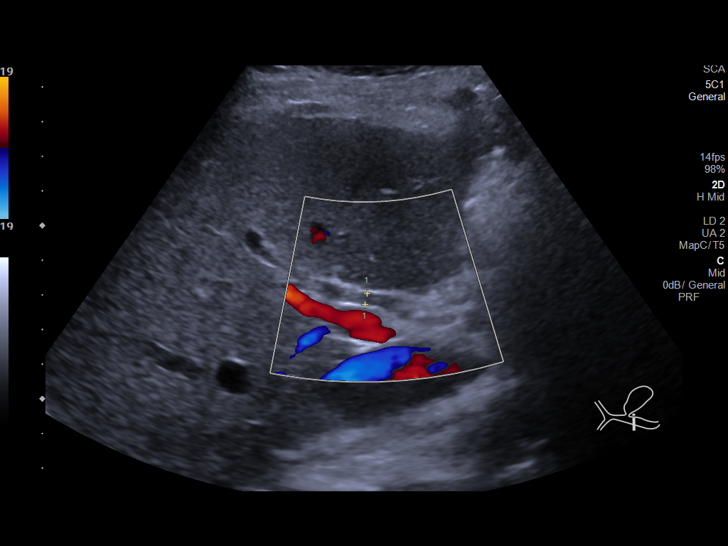
[im 33/50]
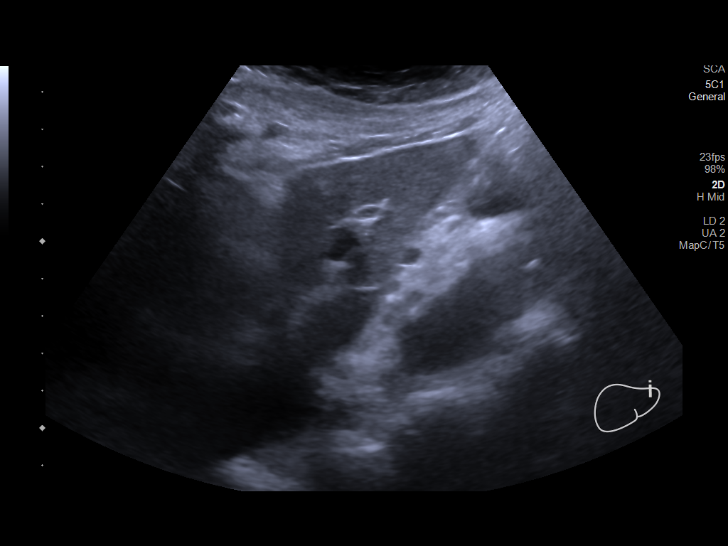
[im 37/50]
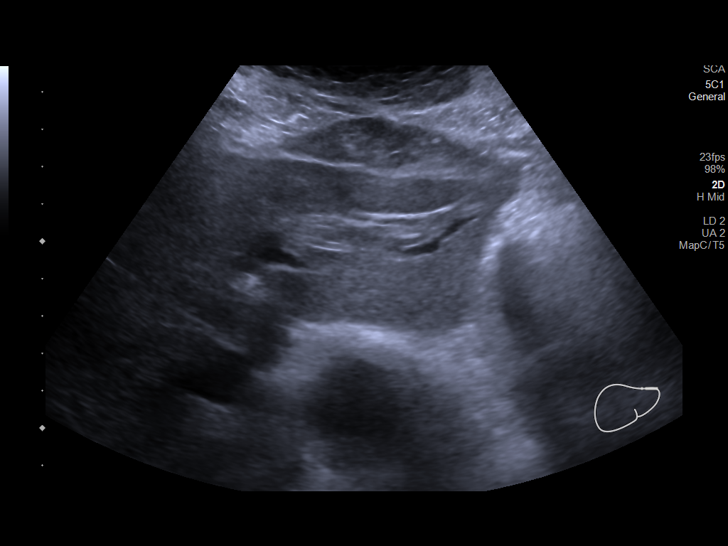
[im 41/50]
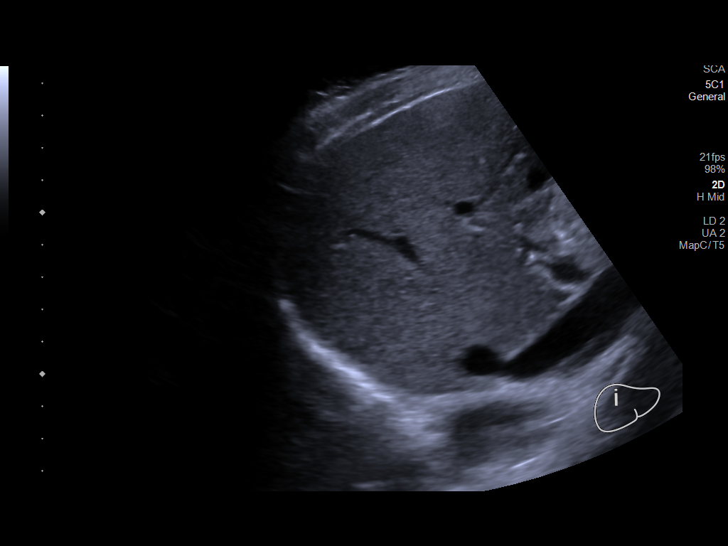
[im 45/50]
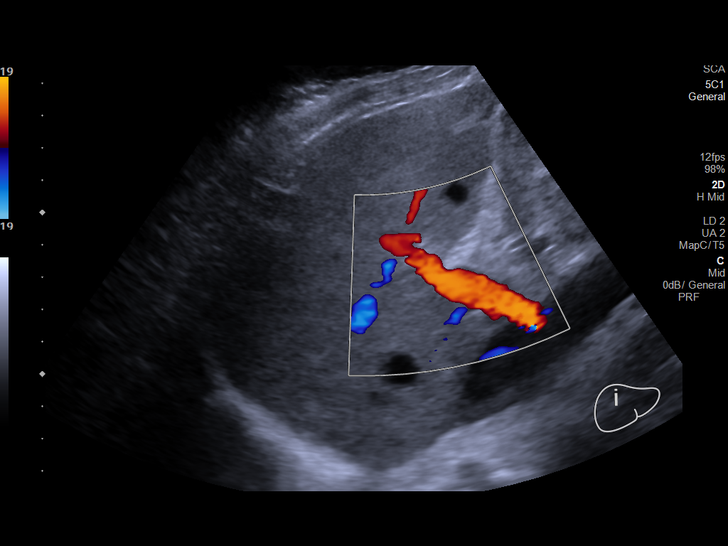
[im 50/50]
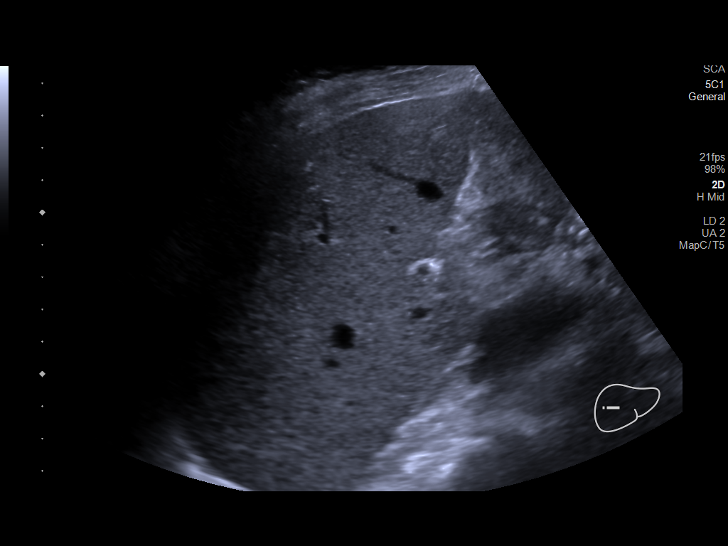

[14 of 25 positions shown; findings below may reference images not displayed]

FINDINGS: Gallbladder:

The gallbladder is contracted. There is heterogeneously echoic
material within the contracted gallbladder without internal
vascularity. The gallbladder wall appears mildly thickened measuring
up to 4 mm, this is likely exaggerated by under distension. No
shadowing stones are identified. There was no sonographic Murphy
sign.

Common bile duct:

Diameter: 3 mm

Liver:

No focal lesion identified. Within normal limits in parenchymal
echogenicity. Portal vein is patent on color Doppler imaging with
normal direction of blood flow towards the liver.

Other: None.
IMPRESSION: Echogenic material within the contracted gallbladder most likely
reflects sludge or poorly calcified stones. No shadowing stones or
evidence of acute cholecystitis.

## 2022-01-26 DIAGNOSIS — M7672 Peroneal tendinitis, left leg: Secondary | ICD-10-CM | POA: Diagnosis not present

## 2022-01-26 DIAGNOSIS — M7671 Peroneal tendinitis, right leg: Secondary | ICD-10-CM | POA: Diagnosis not present

## 2022-01-26 DIAGNOSIS — M79672 Pain in left foot: Secondary | ICD-10-CM | POA: Diagnosis not present

## 2022-01-26 DIAGNOSIS — M79671 Pain in right foot: Secondary | ICD-10-CM | POA: Diagnosis not present

## 2022-01-27 DIAGNOSIS — K5909 Other constipation: Secondary | ICD-10-CM | POA: Diagnosis not present

## 2022-02-09 DIAGNOSIS — H2512 Age-related nuclear cataract, left eye: Secondary | ICD-10-CM | POA: Diagnosis not present

## 2022-02-09 DIAGNOSIS — H25812 Combined forms of age-related cataract, left eye: Secondary | ICD-10-CM | POA: Diagnosis not present

## 2022-02-09 DIAGNOSIS — H269 Unspecified cataract: Secondary | ICD-10-CM | POA: Diagnosis not present

## 2022-02-22 DIAGNOSIS — Z7982 Long term (current) use of aspirin: Secondary | ICD-10-CM | POA: Diagnosis not present

## 2022-02-22 DIAGNOSIS — Z91199 Patient's noncompliance with other medical treatment and regimen due to unspecified reason: Secondary | ICD-10-CM | POA: Diagnosis not present

## 2022-02-22 DIAGNOSIS — Z5111 Encounter for antineoplastic chemotherapy: Secondary | ICD-10-CM | POA: Diagnosis not present

## 2022-02-22 DIAGNOSIS — D471 Chronic myeloproliferative disease: Secondary | ICD-10-CM | POA: Diagnosis not present

## 2022-02-22 DIAGNOSIS — Z7902 Long term (current) use of antithrombotics/antiplatelets: Secondary | ICD-10-CM | POA: Diagnosis not present

## 2022-03-06 DIAGNOSIS — H52223 Regular astigmatism, bilateral: Secondary | ICD-10-CM | POA: Diagnosis not present

## 2022-03-06 DIAGNOSIS — H524 Presbyopia: Secondary | ICD-10-CM | POA: Diagnosis not present

## 2022-03-20 DIAGNOSIS — H53021 Refractive amblyopia, right eye: Secondary | ICD-10-CM | POA: Diagnosis not present

## 2022-03-20 DIAGNOSIS — H2511 Age-related nuclear cataract, right eye: Secondary | ICD-10-CM | POA: Diagnosis not present

## 2022-03-20 DIAGNOSIS — H26492 Other secondary cataract, left eye: Secondary | ICD-10-CM | POA: Diagnosis not present

## 2022-04-10 DIAGNOSIS — M10071 Idiopathic gout, right ankle and foot: Secondary | ICD-10-CM | POA: Diagnosis not present

## 2022-04-10 DIAGNOSIS — E559 Vitamin D deficiency, unspecified: Secondary | ICD-10-CM | POA: Diagnosis not present

## 2022-04-10 DIAGNOSIS — K5909 Other constipation: Secondary | ICD-10-CM | POA: Diagnosis not present

## 2022-04-10 DIAGNOSIS — E7211 Homocystinuria: Secondary | ICD-10-CM | POA: Diagnosis not present

## 2022-04-10 DIAGNOSIS — Z0001 Encounter for general adult medical examination with abnormal findings: Secondary | ICD-10-CM | POA: Diagnosis not present

## 2022-04-10 DIAGNOSIS — E782 Mixed hyperlipidemia: Secondary | ICD-10-CM | POA: Diagnosis not present

## 2022-04-10 DIAGNOSIS — N1832 Chronic kidney disease, stage 3b: Secondary | ICD-10-CM | POA: Diagnosis not present

## 2022-04-10 DIAGNOSIS — I119 Hypertensive heart disease without heart failure: Secondary | ICD-10-CM | POA: Diagnosis not present

## 2022-05-01 DIAGNOSIS — H01026 Squamous blepharitis left eye, unspecified eyelid: Secondary | ICD-10-CM | POA: Diagnosis not present

## 2022-05-01 DIAGNOSIS — H01023 Squamous blepharitis right eye, unspecified eyelid: Secondary | ICD-10-CM | POA: Diagnosis not present

## 2022-05-02 DIAGNOSIS — I119 Hypertensive heart disease without heart failure: Secondary | ICD-10-CM | POA: Diagnosis not present

## 2022-05-02 DIAGNOSIS — E782 Mixed hyperlipidemia: Secondary | ICD-10-CM | POA: Diagnosis not present

## 2022-05-02 DIAGNOSIS — N1832 Chronic kidney disease, stage 3b: Secondary | ICD-10-CM | POA: Diagnosis not present

## 2022-05-12 DIAGNOSIS — E785 Hyperlipidemia, unspecified: Secondary | ICD-10-CM | POA: Diagnosis not present

## 2022-05-12 DIAGNOSIS — N183 Chronic kidney disease, stage 3 unspecified: Secondary | ICD-10-CM | POA: Diagnosis not present

## 2022-05-12 DIAGNOSIS — E875 Hyperkalemia: Secondary | ICD-10-CM | POA: Diagnosis not present

## 2022-05-12 DIAGNOSIS — I10 Essential (primary) hypertension: Secondary | ICD-10-CM | POA: Diagnosis not present

## 2022-05-22 DIAGNOSIS — H01023 Squamous blepharitis right eye, unspecified eyelid: Secondary | ICD-10-CM | POA: Diagnosis not present

## 2022-05-22 DIAGNOSIS — I119 Hypertensive heart disease without heart failure: Secondary | ICD-10-CM | POA: Diagnosis not present

## 2022-05-22 DIAGNOSIS — D7589 Other specified diseases of blood and blood-forming organs: Secondary | ICD-10-CM | POA: Diagnosis not present

## 2022-05-22 DIAGNOSIS — H01026 Squamous blepharitis left eye, unspecified eyelid: Secondary | ICD-10-CM | POA: Diagnosis not present

## 2022-05-22 DIAGNOSIS — E559 Vitamin D deficiency, unspecified: Secondary | ICD-10-CM | POA: Diagnosis not present

## 2022-05-22 DIAGNOSIS — E782 Mixed hyperlipidemia: Secondary | ICD-10-CM | POA: Diagnosis not present

## 2022-05-22 DIAGNOSIS — N1832 Chronic kidney disease, stage 3b: Secondary | ICD-10-CM | POA: Diagnosis not present

## 2022-05-26 DIAGNOSIS — Z1330 Encounter for screening examination for mental health and behavioral disorders, unspecified: Secondary | ICD-10-CM | POA: Diagnosis not present

## 2022-05-26 DIAGNOSIS — M1711 Unilateral primary osteoarthritis, right knee: Secondary | ICD-10-CM | POA: Diagnosis not present

## 2022-06-06 DIAGNOSIS — H02889 Meibomian gland dysfunction of unspecified eye, unspecified eyelid: Secondary | ICD-10-CM | POA: Diagnosis not present

## 2022-06-07 DIAGNOSIS — D472 Monoclonal gammopathy: Secondary | ICD-10-CM | POA: Diagnosis not present

## 2022-06-07 DIAGNOSIS — Z5111 Encounter for antineoplastic chemotherapy: Secondary | ICD-10-CM | POA: Diagnosis not present

## 2022-06-07 DIAGNOSIS — D471 Chronic myeloproliferative disease: Secondary | ICD-10-CM | POA: Diagnosis not present

## 2022-07-13 DIAGNOSIS — Z9849 Cataract extraction status, unspecified eye: Secondary | ICD-10-CM | POA: Diagnosis not present

## 2022-07-13 DIAGNOSIS — N189 Chronic kidney disease, unspecified: Secondary | ICD-10-CM | POA: Diagnosis not present

## 2022-07-13 DIAGNOSIS — I10 Essential (primary) hypertension: Secondary | ICD-10-CM | POA: Diagnosis not present

## 2022-07-13 DIAGNOSIS — E663 Overweight: Secondary | ICD-10-CM | POA: Diagnosis not present

## 2022-07-13 DIAGNOSIS — D471 Chronic myeloproliferative disease: Secondary | ICD-10-CM | POA: Diagnosis not present

## 2022-07-13 DIAGNOSIS — H4010X0 Unspecified open-angle glaucoma, stage unspecified: Secondary | ICD-10-CM | POA: Diagnosis not present

## 2022-07-13 DIAGNOSIS — Z8739 Personal history of other diseases of the musculoskeletal system and connective tissue: Secondary | ICD-10-CM | POA: Diagnosis not present

## 2022-07-26 DIAGNOSIS — M1711 Unilateral primary osteoarthritis, right knee: Secondary | ICD-10-CM | POA: Diagnosis not present

## 2022-08-24 DIAGNOSIS — N1832 Chronic kidney disease, stage 3b: Secondary | ICD-10-CM | POA: Diagnosis not present

## 2022-08-24 DIAGNOSIS — I10 Essential (primary) hypertension: Secondary | ICD-10-CM | POA: Diagnosis not present

## 2022-08-24 DIAGNOSIS — N184 Chronic kidney disease, stage 4 (severe): Secondary | ICD-10-CM | POA: Diagnosis not present

## 2022-08-24 DIAGNOSIS — E663 Overweight: Secondary | ICD-10-CM | POA: Diagnosis not present

## 2022-08-24 DIAGNOSIS — Z79899 Other long term (current) drug therapy: Secondary | ICD-10-CM | POA: Diagnosis not present

## 2022-08-24 DIAGNOSIS — Z0001 Encounter for general adult medical examination with abnormal findings: Secondary | ICD-10-CM | POA: Diagnosis not present

## 2022-08-24 DIAGNOSIS — Z0189 Encounter for other specified special examinations: Secondary | ICD-10-CM | POA: Diagnosis not present

## 2022-08-24 DIAGNOSIS — I700 Atherosclerosis of aorta: Secondary | ICD-10-CM | POA: Diagnosis not present

## 2022-08-24 DIAGNOSIS — M1711 Unilateral primary osteoarthritis, right knee: Secondary | ICD-10-CM | POA: Diagnosis not present
